# Patient Record
Sex: Female | Born: 2000 | Race: Black or African American | Hispanic: No | Smoking: Never smoker
Health system: Southern US, Community
[De-identification: ages and names within clinical notes are randomized; demographics above are authoritative.]

## PROBLEM LIST (undated history)

## (undated) ENCOUNTER — Inpatient Hospital Stay (HOSPITAL_COMMUNITY): Payer: Self-pay

## (undated) DIAGNOSIS — Z8619 Personal history of other infectious and parasitic diseases: Secondary | ICD-10-CM

## (undated) DIAGNOSIS — D649 Anemia, unspecified: Secondary | ICD-10-CM

## (undated) DIAGNOSIS — T7840XA Allergy, unspecified, initial encounter: Secondary | ICD-10-CM

## (undated) DIAGNOSIS — Z789 Other specified health status: Secondary | ICD-10-CM

## (undated) HISTORY — DX: Personal history of other infectious and parasitic diseases: Z86.19

## (undated) HISTORY — PX: WISDOM TOOTH EXTRACTION: SHX21

## (undated) HISTORY — DX: Allergy, unspecified, initial encounter: T78.40XA

## (undated) HISTORY — DX: Anemia, unspecified: D64.9

## (undated) HISTORY — PX: NO PAST SURGERIES: SHX2092

---
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
## 2018-02-15 ENCOUNTER — Telehealth: Payer: Self-pay | Admitting: Internal Medicine

## 2018-02-15 NOTE — Telephone Encounter (Signed)
Pt need a depo shot by 1.29.20 and no nurse visit slots was available. Advise mom someone would call her to schedule.

## 2018-02-18 NOTE — Telephone Encounter (Signed)
Pt is scheduled for 02/23/18 for Depo

## 2018-02-23 ENCOUNTER — Ambulatory Visit (INDEPENDENT_AMBULATORY_CARE_PROVIDER_SITE_OTHER): Payer: BC Managed Care – PPO

## 2018-02-23 DIAGNOSIS — Z3042 Encounter for surveillance of injectable contraceptive: Secondary | ICD-10-CM | POA: Diagnosis not present

## 2018-02-23 MED ORDER — MEDROXYPROGESTERONE ACETATE 150 MG/ML IM SUSP
150.0000 mg | Freq: Once | INTRAMUSCULAR | Status: AC
  Administered 2018-02-23: 150 mg via INTRAMUSCULAR

## 2018-02-23 NOTE — Progress Notes (Signed)
Per orders Nicki Reaper, NP-C, injection of Depo Provera given by Roena Malady. Patient tolerated injection well. Patient prefers arm injection

## 2018-02-24 ENCOUNTER — Ambulatory Visit: Payer: BC Managed Care – PPO

## 2018-04-12 ENCOUNTER — Other Ambulatory Visit: Payer: Self-pay

## 2018-04-12 ENCOUNTER — Ambulatory Visit
Admission: RE | Admit: 2018-04-12 | Discharge: 2018-04-12 | Disposition: A | Discharge status: Home / Self Care | Payer: BC Managed Care – PPO | Source: Ambulatory Visit | Attending: Internal Medicine | Admitting: Internal Medicine

## 2018-04-12 ENCOUNTER — Encounter: Payer: Self-pay | Admitting: Internal Medicine

## 2018-04-12 ENCOUNTER — Ambulatory Visit (INDEPENDENT_AMBULATORY_CARE_PROVIDER_SITE_OTHER): Payer: BC Managed Care – PPO | Admitting: Internal Medicine

## 2018-04-12 VITALS — BP 112/70 | HR 97 | Temp 98.2°F | Wt 132.0 lb

## 2018-04-12 DIAGNOSIS — R1030 Lower abdominal pain, unspecified: Secondary | ICD-10-CM | POA: Diagnosis not present

## 2018-04-12 DIAGNOSIS — R6883 Chills (without fever): Secondary | ICD-10-CM | POA: Diagnosis not present

## 2018-04-12 DIAGNOSIS — R109 Unspecified abdominal pain: Secondary | ICD-10-CM

## 2018-04-12 DIAGNOSIS — R110 Nausea: Secondary | ICD-10-CM

## 2018-04-12 LAB — COMPREHENSIVE METABOLIC PANEL
ALK PHOS: 88 U/L (ref 47–119)
ALT: 10 U/L (ref 0–35)
AST: 11 U/L (ref 0–37)
Albumin: 4.5 g/dL (ref 3.5–5.2)
BUN: 15 mg/dL (ref 6–23)
CALCIUM: 9.1 mg/dL (ref 8.4–10.5)
CHLORIDE: 109 meq/L (ref 96–112)
CO2: 26 mEq/L (ref 19–32)
Creatinine, Ser: 0.73 mg/dL (ref 0.40–1.20)
GFR: 123.7 mL/min (ref >60.00)
Glucose, Bld: 82 mg/dL (ref 70–99)
POTASSIUM: 3.7 meq/L (ref 3.5–5.1)
SODIUM: 141 meq/L (ref 135–145)
TOTAL PROTEIN: 7.4 g/dL (ref 6.0–8.3)
Total Bilirubin: 1.1 mg/dL (ref 0.2–1.2)

## 2018-04-12 LAB — CBC
HEMATOCRIT: 38.8 % (ref 36.0–49.0)
HEMOGLOBIN: 13 g/dL (ref 12.0–16.0)
MCHC: 33.5 g/dL (ref 31.0–37.0)
MCV: 88.2 fl (ref 78.0–98.0)
PLATELETS: 169 10*3/uL (ref 150.0–575.0)
RBC: 4.4 Mil/uL (ref 3.80–5.70)
RDW: 12.7 % (ref 11.4–15.5)
WBC: 4.4 10*3/uL — ABNORMAL LOW (ref 4.5–13.5)

## 2018-04-12 LAB — POC URINALSYSI DIPSTICK (AUTOMATED)
Blood, UA: NEGATIVE
Glucose, UA: NEGATIVE
KETONES UA: NEGATIVE
Leukocytes, UA: NEGATIVE
Nitrite, UA: NEGATIVE
PH UA: 5.5 (ref 5.0–8.0)
PROTEIN UA: NEGATIVE
Spec Grav, UA: >=1.03 — AB (ref 1.010–1.025)
UROBILINOGEN UA: 0.2 U/dL

## 2018-04-12 NOTE — Progress Notes (Signed)
Subjective:    Patient ID: Sharon Fuller, female    DOB: 05/28/2000, 18 y.o.   MRN: [id number]  HPI  Pt presents to the clinic today with c/o abdominal pain. She reports this started last night. The pain is around the belly button. The pain radiates into her right side of her back. She describes the pain as constant cramping but intermittent sharp and stabbing. She has had some nausea, gas but denies vomiting, diarrhea, constipation or blood in her stool. She denies urinary or vaginal complaints. She is currently on her menses. She denies fever or chills but has had body aches. She has not had a recent change in diet or medications. She is not taking OTC NSAIDs. She has never had kidney stones but does have a family history of them.  Review of Systems      Past Medical History:  Diagnosis Date  . Allergy   . History of chicken pox   . Medical history non-contributory     Current Outpatient Medications  Medication Sig Dispense Refill  . medroxyPROGESTERone (DEPO-PROVERA) 150 MG/ML injection Depo-Provera 150 mg/mL intramuscular suspension  Inject 1 mL every 3 months by intramuscular route.     No current facility-administered medications for this visit.     No Known Allergies  Family History  Problem Relation Age of Onset  . Kidney failure Mother   . Breast cancer Maternal Grandmother   . Kidney failure Maternal Grandfather   . Asthma Father     Social History   Socioeconomic History  . Marital status: Single    Spouse name: Not on file  . Number of children: Not on file  . Years of education: Not on file  . Highest education level: Not on file  Occupational History  . Not on file  Social Needs  . Financial resource strain: Not on file  . Food insecurity:    Worry: Not on file    Inability: Not on file  . Transportation needs:    Medical: Not on file    Non-medical: Not on file  Tobacco Use  . Smoking status: Never Smoker  . Smokeless tobacco: Never Used   Substance and Sexual Activity  . Alcohol use: Not Currently    Frequency: Never    Comment: not during pregnancy  . Drug use: No  . Sexual activity: Yes    Birth control/protection: None  Lifestyle  . Physical activity:    Days per week: Not on file    Minutes per session: Not on file  . Stress: Not on file  Relationships  . Social connections:    Talks on phone: Not on file    Gets together: Not on file    Attends religious service: Not on file    Active member of club or organization: Not on file    Attends meetings of clubs or organizations: Not on file    Relationship status: Not on file  . Intimate partner violence:    Fear of current or ex partner: Not on file    Emotionally abused: Not on file    Physically abused: Not on file    Forced sexual activity: Not on file  Other Topics Concern  . Not on file  Social History Narrative  . Not on file     Constitutional: Denies fever, malaise, fatigue, headache or abrupt weight changes.  Respiratory: Denies difficulty breathing, shortness of breath, cough or sputum production.   Cardiovascular: Denies chest pain, chest tightness, palpitations  or swelling in the hands or feet.  Gastrointestinal: Pt reports abdominal pain, nausea, gas. Denies bloating, constipation, diarrhea or blood in the stool.  GU: Denies urgency, frequency, pain with urination, burning sensation, blood in urine, odor or discharge.  No other specific complaints in a complete review of systems (except as listed in HPI above).  Objective:   Physical Exam   Wt 132 lb (59.9 kg)   LMP 04/09/2018   BMI 23.76 kg/m  Wt Readings from Last 3 Encounters:  04/12/18 132 lb (59.9 kg) (58 %, Z= 0.21)*  11/24/17 127 lb (57.6 kg) (51 %, Z= 0.03)*  06/16/17 147 lb 6.4 oz (66.9 kg) (81 %, Z= 0.87)*   * Growth percentiles are based on CDC (Girls, 2-20 Years) data.    General: Appears her stated age, well developed, well nourished in NAD. Skin: Warm, dry and  intact. No rashesnoted. Cardiovascular: Normal rate and rhythm.  Pulmonary/Chest: Normal effort and positive vesicular breath sounds. No respiratory distress. No wheezes, rales or ronchi noted.  Abdomen: Soft and generally tender in bilateral lower quadrants. Hypoactive bowel sounds. No distention or masses noted. No CVA tenderness noted Neurological: Alert and oriented.        Assessment & Plan:   Lower Abdominal Pain, Right Flank Pain, Nausea and Chills:  Concerning for kidney stone Urinalysis: 1+ bilirubin Will send urine culture CBC and CMET today Will obtain CT renal stone study  Will follow up after results, return/ER precautions discussed Nicki Reaper, NP

## 2018-04-12 NOTE — Patient Instructions (Signed)

## 2018-04-13 LAB — URINE CULTURE
MICRO NUMBER:: 328436
SPECIMEN QUALITY:: ADEQUATE

## 2018-04-21 ENCOUNTER — Encounter: Payer: Self-pay | Admitting: Internal Medicine

## 2018-04-26 ENCOUNTER — Encounter: Payer: Self-pay | Admitting: Internal Medicine

## 2018-04-26 ENCOUNTER — Other Ambulatory Visit: Payer: Self-pay

## 2018-04-26 ENCOUNTER — Ambulatory Visit (INDEPENDENT_AMBULATORY_CARE_PROVIDER_SITE_OTHER): Payer: BC Managed Care – PPO | Admitting: Internal Medicine

## 2018-04-26 DIAGNOSIS — J301 Allergic rhinitis due to pollen: Secondary | ICD-10-CM

## 2018-04-26 NOTE — Patient Instructions (Signed)

## 2018-04-26 NOTE — Progress Notes (Signed)
Virtual Visit via Video Note  I connected with Sharon Fuller on 04/26/18 at  3:30 PM EDT by a video enabled telemedicine application and verified that I am speaking with the correct person using two identifiers.   I discussed the limitations of evaluation and management by telemedicine and the availability of in person appointments. The patient expressed understanding and agreed to proceed.  History of Present Illness:  Pt reports runny nose, sore throat and cough. This started 2-3 days ago. She is blowing yellow/green mucous out of her nose. She denies difficulty swallowing. The cough is productive of yellow/green mucous She reports associated ear fullness sneezing. She denies headache, facial pressure or shortness of breath. She denies fever, chills or body aches. She has not taken anything OTC for her symptoms. She has a history of allergies but reports this feels worse. She has not had sick contacts.   Observations/Objective:  Alert and oriented x 3 NAD Does not sound congested No obvious SOB  Assessment and Plan:  Allergic Rhinitis:  Start Claritin daily in pm x 1 week Start Flonase 1 spray each nostril BID x 3 days then daily thereafter No indication for abx at this time Advised her to update me Friday/Monday if symptoms persist or worsen  Follow Up Instructions:    I discussed the assessment and treatment plan with the patient. The patient was provided an opportunity to ask questions and all were answered. The patient agreed with the plan and demonstrated an understanding of the instructions.   The patient was advised to call back or seek an in-person evaluation if the symptoms worsen or if the condition fails to improve as anticipated.    Nicki Reaper, NP

## 2018-05-12 ENCOUNTER — Ambulatory Visit: Payer: BC Managed Care – PPO

## 2018-05-18 ENCOUNTER — Ambulatory Visit (INDEPENDENT_AMBULATORY_CARE_PROVIDER_SITE_OTHER): Payer: BC Managed Care – PPO

## 2018-05-18 ENCOUNTER — Other Ambulatory Visit: Payer: Self-pay

## 2018-05-18 DIAGNOSIS — Z3042 Encounter for surveillance of injectable contraceptive: Secondary | ICD-10-CM | POA: Diagnosis not present

## 2018-05-18 MED ORDER — MEDROXYPROGESTERONE ACETATE 150 MG/ML IM SUSP
150.0000 mg | Freq: Once | INTRAMUSCULAR | Status: AC
  Administered 2018-05-18: 150 mg via INTRAMUSCULAR

## 2018-05-18 NOTE — Progress Notes (Signed)
Per orders of NP Baity, injection of Depo-Provera given by Nanci Pina. Patient tolerated injection well.

## 2018-08-11 ENCOUNTER — Ambulatory Visit (INDEPENDENT_AMBULATORY_CARE_PROVIDER_SITE_OTHER): Payer: BC Managed Care – PPO

## 2018-08-11 ENCOUNTER — Other Ambulatory Visit: Payer: Self-pay

## 2018-08-11 DIAGNOSIS — Z3042 Encounter for surveillance of injectable contraceptive: Secondary | ICD-10-CM | POA: Diagnosis not present

## 2018-08-11 MED ORDER — MEDROXYPROGESTERONE ACETATE 150 MG/ML IM SUSP
150.0000 mg | Freq: Once | INTRAMUSCULAR | Status: AC
  Administered 2018-08-11: 150 mg via INTRAMUSCULAR

## 2018-08-11 NOTE — Progress Notes (Signed)
Per orders of Jacquelynn Cree, injection of Depo Provera 150mg  given by Diamond Nickel, RN.  Administered to right deltoid IM.   Patient requests depo be administered to smaller deltoid muscle despite education of risks given thickness and irritation of medication.  Although it can be administered in the deltoid, it is best to be administered in a larger muscle such as the ventro/dorsal gluteal muscle.    Patient tolerated injection well.  Patient is within range to move forward with today's admin and given next due date range between 10/1 - 10/15.

## 2018-09-05 ENCOUNTER — Encounter: Payer: Self-pay | Admitting: Internal Medicine

## 2018-09-05 ENCOUNTER — Ambulatory Visit (INDEPENDENT_AMBULATORY_CARE_PROVIDER_SITE_OTHER): Payer: BC Managed Care – PPO | Admitting: Internal Medicine

## 2018-09-05 ENCOUNTER — Other Ambulatory Visit: Payer: Self-pay

## 2018-09-05 VITALS — BP 112/72 | HR 92 | Temp 98.0°F | Ht 62.5 in | Wt 134.0 lb

## 2018-09-05 DIAGNOSIS — B354 Tinea corporis: Secondary | ICD-10-CM | POA: Diagnosis not present

## 2018-09-05 MED ORDER — TERBINAFINE HCL 250 MG PO TABS: 250.0000 mg | ORAL_TABLET | Freq: Every day | ORAL | 0 refills | Status: DC

## 2018-09-05 NOTE — Progress Notes (Signed)
Subjective:    Patient ID: Sharon Fuller, female    DOB: [birth year], 18 y.o.   MRN: [id number]  HPI  Pt presents to the clinic today with c/o a rash on her stomach. She noticed this 2 months ago. The rash is located on her upper stomach and has now spread under her right breast. She denies changes in soap, lotions or detergents. She has not come in contact with anything that she is allergic to. She has tried lotion and Vit E oil with minimal relief.  Review of Systems  Past Medical History:  Diagnosis Date  . Allergy   . History of chicken pox   . Medical history non-contributory     Current Outpatient Medications  Medication Sig Dispense Refill  . medroxyPROGESTERone (DEPO-PROVERA) 150 MG/ML injection Depo-Provera 150 mg/mL intramuscular suspension  Inject 1 mL every 3 months by intramuscular route.     No current facility-administered medications for this visit.     No Known Allergies  Family History  Problem Relation Age of Onset  . Kidney failure Mother   . Breast cancer Maternal Grandmother   . Kidney failure Maternal Grandfather   . Asthma Father     Social History   Socioeconomic History  . Marital status: Single    Spouse name: Not on file  . Number of children: Not on file  . Years of education: Not on file  . Highest education level: Not on file  Occupational History  . Not on file  Social Needs  . Financial resource strain: Not on file  . Food insecurity    Worry: Not on file    Inability: Not on file  . Transportation needs    Medical: Not on file    Non-medical: Not on file  Tobacco Use  . Smoking status: Never Smoker  . Smokeless tobacco: Never Used  Substance and Sexual Activity  . Alcohol use: Not Currently    Frequency: Never    Comment: not during pregnancy  . Drug use: No  . Sexual activity: Yes    Birth control/protection: None  Lifestyle  . Physical activity    Days per week: Not on file    Minutes per session: Not on file  .  Stress: Not on file  Relationships  . Social Herbalist on phone: Not on file    Gets together: Not on file    Attends religious service: Not on file    Active member of club or organization: Not on file    Attends meetings of clubs or organizations: Not on file    Relationship status: Not on file  . Intimate partner violence    Fear of current or ex partner: Not on file    Emotionally abused: Not on file    Physically abused: Not on file    Forced sexual activity: Not on file  Other Topics Concern  . Not on file  Social History Narrative  . Not on file     Constitutional: Denies fever, malaise, fatigue, headache or abrupt weight changes.  Respiratory: Denies difficulty breathing, shortness of breath, cough or sputum production.   Cardiovascular: Denies chest pain, chest tightness, palpitations or swelling in the hands or feet.  Skin: Pt reports rash of stomach. Denies lesions or ulcercations.    No other specific complaints in a complete review of systems (except as listed in HPI above).     Objective:   Physical Exam   BP 112/72  Pulse 92   Temp 98 F (36.7 C) (Temporal)   Ht 5' 2.5" (1.588 m)   Wt 134 lb (60.8 kg)   SpO2 98%   BMI 24.12 kg/m  Wt Readings from Last 3 Encounters:  09/05/18 134 lb (60.8 kg) (60 %, Z= 0.26)*  04/12/18 132 lb (59.9 kg) (58 %, Z= 0.21)*  11/24/17 127 lb (57.6 kg) (51 %, Z= 0.03)*   * Growth percentiles are based on CDC (Girls, 2-20 Years) data.    General: Appears their stated age, well developed, well nourished in NAD. Skin: 1.5 cm round scaly lesion with central clearing of right upper abdomen. Similar smaller group of lesions noted under right breast. Cardiovascular: Normal rate and rhythm.  Pulmonary/Chest: Normal effort and positive vesicular breath sounds. No respiratory distress. No wheezes, rales or ronchi noted.  Neurological: Alert and oriented.   BMET    Component Value Date/Time   NA 141 04/12/2018 1145    K 3.7 04/12/2018 1145   CL 109 04/12/2018 1145   CO2 26 04/12/2018 1145   GLUCOSE 82 04/12/2018 1145   BUN 15 04/12/2018 1145   CREATININE 0.73 04/12/2018 1145   CALCIUM 9.1 04/12/2018 1145    Lipid Panel  No results found for: CHOL, TRIG, HDL, CHOLHDL, VLDL, LDLCALC  CBC    Component Value Date/Time   WBC 4.4 (L) 04/12/2018 1145   RBC 4.40 04/12/2018 1145   HGB 13.0 04/12/2018 1145   HCT 38.8 04/12/2018 1145   PLT 169.0 04/12/2018 1145   MCV 88.2 04/12/2018 1145   MCH 31.8 06/17/2017 0540   MCHC 33.5 04/12/2018 1145   RDW 12.7 04/12/2018 1145    Hgb A1C No results found for: HGBA1C         Assessment & Plan:   Rash of Stomach:  CMET reviewed RX for Terbinafine 250 mg daily x 2 weeks Wash hands after touching area   Return precautions discussed Nicki Reaperegina Baity, NP

## 2018-09-06 ENCOUNTER — Encounter: Payer: Self-pay | Admitting: Internal Medicine

## 2018-09-06 NOTE — Patient Instructions (Signed)

## 2018-09-28 ENCOUNTER — Telehealth: Payer: Self-pay | Admitting: *Deleted

## 2018-09-28 DIAGNOSIS — B354 Tinea corporis: Secondary | ICD-10-CM

## 2018-09-28 NOTE — Addendum Note (Signed)
Addended by: Jearld Fenton on: 09/28/2018 11:48 AM   Modules accepted: Orders

## 2018-09-28 NOTE — Telephone Encounter (Signed)
Patient left a voicemail stating that she has been treated for ringworm and has finished her medication. Patent stated that it is spreading now and wants to know what to do? Pharmacy [facility]

## 2018-09-28 NOTE — Telephone Encounter (Signed)
I am going to refer her to derm for further eval and treatment.

## 2018-09-29 ENCOUNTER — Telehealth: Payer: Self-pay | Admitting: Internal Medicine

## 2018-09-29 NOTE — Telephone Encounter (Signed)
Pt is aware as instructed 

## 2018-09-29 NOTE — Telephone Encounter (Signed)
Pt wanted to update you that she finished medicine and it didn't work. Please call patient.  [phone]

## 2018-10-27 ENCOUNTER — Ambulatory Visit: Payer: BC Managed Care – PPO

## 2018-12-02 ENCOUNTER — Encounter: Payer: Self-pay | Admitting: Internal Medicine

## 2019-01-11 ENCOUNTER — Ambulatory Visit: Payer: BC Managed Care – PPO

## 2019-01-11 ENCOUNTER — Other Ambulatory Visit: Payer: Self-pay

## 2019-01-16 ENCOUNTER — Ambulatory Visit (INDEPENDENT_AMBULATORY_CARE_PROVIDER_SITE_OTHER): Payer: BC Managed Care – PPO

## 2019-01-16 DIAGNOSIS — Z111 Encounter for screening for respiratory tuberculosis: Secondary | ICD-10-CM | POA: Diagnosis not present

## 2019-01-16 NOTE — Progress Notes (Signed)
Per orders of Webb Silversmith, NP-C, TB Skin test, Right forearm given by Lurlean Nanny.  Patient tolerated well.

## 2019-01-18 LAB — TB SKIN TEST: TB Skin Test: NEGATIVE

## 2019-01-23 ENCOUNTER — Telehealth: Payer: Self-pay

## 2019-01-23 NOTE — Telephone Encounter (Signed)
Pt is aware that her portion is not filled out and needs her signature

## 2019-01-23 NOTE — Telephone Encounter (Signed)
Patient contacted the office and stated that she had a TB test completed and had given some paperwork to [facility] to be completed for her work with these results. Threasa Beards, do you have any paperwork from patient?  Let me know and I will be glad to call back and let her know the status. Thank you!

## 2019-02-21 ENCOUNTER — Ambulatory Visit (INDEPENDENT_AMBULATORY_CARE_PROVIDER_SITE_OTHER): Payer: BC Managed Care – PPO | Admitting: Internal Medicine

## 2019-02-21 ENCOUNTER — Encounter: Payer: Self-pay | Admitting: Internal Medicine

## 2019-02-21 ENCOUNTER — Other Ambulatory Visit: Payer: Self-pay

## 2019-02-21 VITALS — BP 112/74 | HR 84 | Temp 97.7°F | Wt 134.0 lb

## 2019-02-21 DIAGNOSIS — N898 Other specified noninflammatory disorders of vagina: Secondary | ICD-10-CM

## 2019-02-21 MED ORDER — FLUCONAZOLE 150 MG PO TABS: 150.0000 mg | ORAL_TABLET | Freq: Once | ORAL | 0 refills | Status: AC

## 2019-02-21 NOTE — Addendum Note (Signed)
Addended by: Roena Malady on: 02/21/2019 03:45 PM   Modules accepted: Orders

## 2019-02-21 NOTE — Patient Instructions (Signed)
Vaginal Yeast Infection, Adult  Vaginal yeast infection is a condition that causes vaginal discharge as well as soreness, swelling, and redness (inflammation) of the vagina. This is a common condition. Some women get this infection frequently. What are the causes? This condition is caused by a change in the normal balance of the yeast (candida) and bacteria that live in the vagina. This change causes an overgrowth of yeast, which causes the inflammation. What increases the risk? The condition is more likely to develop in women who:  Take antibiotic medicines.  Have diabetes.  Take birth control pills.  Are pregnant.  Douche often.  Have a weak body defense system (immune system).  Have been taking steroid medicines for a long time.  Frequently wear tight clothing. What are the signs or symptoms? Symptoms of this condition include:  White, thick, creamy vaginal discharge.  Swelling, itching, redness, and irritation of the vagina. The lips of the vagina (vulva) may be affected as well.  Pain or a burning feeling while urinating.  Pain during sex. How is this diagnosed? This condition is diagnosed based on:  Your medical history.  A physical exam.  A pelvic exam. Your health care provider will examine a sample of your vaginal discharge under a microscope. Your health care provider may send this sample for testing to confirm the diagnosis. How is this treated? This condition is treated with medicine. Medicines may be over-the-counter or prescription. You may be told to use one or more of the following:  Medicine that is taken by mouth (orally).  Medicine that is applied as a cream (topically).  Medicine that is inserted directly into the vagina (suppository). Follow these instructions at home:  Lifestyle  Do not have sex until your health care provider approves. Tell your sex partner that you have a yeast infection. That person should go to his or her health care  provider and ask if they should also be treated.  Do not wear tight clothes, such as pantyhose or tight pants.  Wear breathable cotton underwear. General instructions  Take or apply over-the-counter and prescription medicines only as told by your health care provider.  Eat more yogurt. This may help to keep your yeast infection from returning.  Do not use tampons until your health care provider approves.  Try taking a sitz bath to help with discomfort. This is a warm water bath that is taken while you are sitting down. The water should only come up to your hips and should cover your buttocks. Do this 3-4 times per day or as told by your health care provider.  Do not douche.  If you have diabetes, keep your blood sugar levels under control.  Keep all follow-up visits as told by your health care provider. This is important. Contact a health care provider if:  You have a fever.  Your symptoms go away and then return.  Your symptoms do not get better with treatment.  Your symptoms get worse.  You have new symptoms.  You develop blisters in or around your vagina.  You have blood coming from your vagina and it is not your menstrual period.  You develop pain in your abdomen. Summary  Vaginal yeast infection is a condition that causes discharge as well as soreness, swelling, and redness (inflammation) of the vagina.  This condition is treated with medicine. Medicines may be over-the-counter or prescription.  Take or apply over-the-counter and prescription medicines only as told by your health care provider.  Do not douche.   Do not have sex or use tampons until your health care provider approves.  Contact a health care provider if your symptoms do not get better with treatment or your symptoms go away and then return. This information is not intended to replace advice given to you by your health care provider. Make sure you discuss any questions you have with your health care  provider. Document Revised: 08/12/2018 Document Reviewed: 05/31/2017 Elsevier Patient Education  2020 Elsevier Inc.  

## 2019-02-21 NOTE — Progress Notes (Signed)
Subjective:    Patient ID: Sharon Fuller, female    DOB: [birth year], 19 y.o.   MRN: [id number]  HPI  Pt presents to clinic today with c/o vaginal itching and white discharge. The white discharge began 3 weeks ago and the itching began 5 days ago. She took a bubble bath a few weeks ago. She has had yeast infections before. She denies fever, abdominal pain, urgency, frequency, dysuria or blood in her stool. She has no concerns about STD's.   Review of Systems  Past Medical History:  Diagnosis Date  . Allergy   . History of chicken pox   . Medical history non-contributory     Current Outpatient Medications  Medication Sig Dispense Refill  . medroxyPROGESTERone (DEPO-PROVERA) 150 MG/ML injection Depo-Provera 150 mg/mL intramuscular suspension  Inject 1 mL every 3 months by intramuscular route.    . terbinafine (LAMISIL) 250 MG tablet Take 1 tablet (250 mg total) by mouth daily. 14 tablet 0   No current facility-administered medications for this visit.    No Known Allergies  Family History  Problem Relation Age of Onset  . Kidney failure Mother   . Breast cancer Maternal Grandmother   . Kidney failure Maternal Grandfather   . Asthma Father     Social History   Socioeconomic History  . Marital status: Single    Spouse name: Not on file  . Number of children: Not on file  . Years of education: Not on file  . Highest education level: Not on file  Occupational History  . Not on file  Tobacco Use  . Smoking status: Never Smoker  . Smokeless tobacco: Never Used  Substance and Sexual Activity  . Alcohol use: Not Currently    Comment: not during pregnancy  . Drug use: No  . Sexual activity: Yes    Birth control/protection: None  Other Topics Concern  . Not on file  Social History Narrative  . Not on file   Social Determinants of Health   Financial Resource Strain:   . Difficulty of Paying Living Expenses: Not on file  Food Insecurity:   . Worried About Community education officer in the Last Year: Not on file  . Ran Out of Food in the Last Year: Not on file  Transportation Needs:   . Lack of Transportation (Medical): Not on file  . Lack of Transportation (Non-Medical): Not on file  Physical Activity:   . Days of Exercise per Week: Not on file  . Minutes of Exercise per Session: Not on file  Stress:   . Feeling of Stress : Not on file  Social Connections:   . Frequency of Communication with Friends and Family: Not on file  . Frequency of Social Gatherings with Friends and Family: Not on file  . Attends Religious Services: Not on file  . Active Member of Clubs or Organizations: Not on file  . Attends Banker Meetings: Not on file  . Marital Status: Not on file  Intimate Partner Violence:   . Fear of Current or Ex-Partner: Not on file  . Emotionally Abused: Not on file  . Physically Abused: Not on file  . Sexually Abused: Not on file     Constitutional: Denies fever, malaise, fatigue, headache or abrupt weight changes.  Gastrointestinal: Denies abdominal pain, bloating, constipation, diarrhea or blood in the stool.  GU: Pt reports white vaginal discharge and itching. Denies urgency, frequency, pain with urination, burning sensation, blood in urine, or odor.  No other specific complaints in a complete review of systems (except as listed in HPI above).     Objective:   Physical Exam  BP 112/74   Pulse 84   Temp 97.7 F (36.5 C) (Temporal)   Wt 134 lb (60.8 kg)   LMP 02/20/2019   BMI 24.12 kg/m   Wt Readings from Last 3 Encounters:  09/05/18 134 lb (60.8 kg) (60 %, Z= 0.26)*  04/12/18 132 lb (59.9 kg) (58 %, Z= 0.21)*  11/24/17 127 lb (57.6 kg) (51 %, Z= 0.03)*   * Growth percentiles are based on CDC (Girls, 2-20 Years) data.    General: Appears her stated age, well developed, well nourished in NAD. Cardiovascular: Normal rate and rhythm. S1,S2 noted.  No murmur, rubs or gallops noted.  Pulmonary/Chest: Normal effort  and positive vesicular breath sounds. No respiratory distress. No wheezes, rales or ronchi noted.  Abdomen: Soft and nontender.  Pelvic: Self swabbed. Neurological: Alert and oriented.    BMET    Component Value Date/Time   NA 141 04/12/2018 1145   K 3.7 04/12/2018 1145   CL 109 04/12/2018 1145   CO2 26 04/12/2018 1145   GLUCOSE 82 04/12/2018 1145   BUN 15 04/12/2018 1145   CREATININE 0.73 04/12/2018 1145   CALCIUM 9.1 04/12/2018 1145    Lipid Panel  No results found for: CHOL, TRIG, HDL, CHOLHDL, VLDL, LDLCALC  CBC    Component Value Date/Time   WBC 4.4 (L) 04/12/2018 1145   RBC 4.40 04/12/2018 1145   HGB 13.0 04/12/2018 1145   HCT 38.8 04/12/2018 1145   PLT 169.0 04/12/2018 1145   MCV 88.2 04/12/2018 1145   MCH 31.8 06/17/2017 0540   MCHC 33.5 04/12/2018 1145   RDW 12.7 04/12/2018 1145    Hgb A1C No results found for: HGBA1C         Assessment & Plan:   Vaginal Itching and Discharge:  Rx sent for Fluconazole 150mg  po x1 day, repeat in 3 days if symptoms persist  Will send wet prep Advised to avoid bubbles when taking a bath   Will follow up after labs, return precautions discussed  Webb Silversmith, NP This visit occurred during the SARS-CoV-2 public health emergency.  Safety protocols were in place, including screening questions prior to the visit, additional usage of staff PPE, and extensive cleaning of exam room while observing appropriate contact time as indicated for disinfecting solutions.

## 2019-02-22 LAB — WET PREP BY MOLECULAR PROBE
Candida species: DETECTED — AB
Gardnerella vaginalis: NOT DETECTED
MICRO NUMBER:: 10081807
SPECIMEN QUALITY:: ADEQUATE
Trichomonas vaginosis: NOT DETECTED

## 2019-03-16 ENCOUNTER — Ambulatory Visit: Payer: BC Managed Care – PPO | Admitting: Internal Medicine

## 2019-03-21 ENCOUNTER — Ambulatory Visit: Payer: BC Managed Care – PPO | Admitting: Internal Medicine

## 2019-04-11 ENCOUNTER — Ambulatory Visit: Payer: BC Managed Care – PPO | Admitting: Internal Medicine

## 2019-04-11 ENCOUNTER — Telehealth: Payer: Self-pay

## 2019-04-11 NOTE — Telephone Encounter (Signed)
[facility] Night - Client Nonclinical Telephone Record AccessNurse Client [facility] Night - Client Client Site [facility] - Night Physician Nicki Reaper - NP Contact Type Call Who Is Calling Patient / Member / Family / Caregiver Caller Name Dulcinea Kinser Caller Phone Number [phone] Patient Name Lotta Frankenfield Patient DOB Apr 18, 2000 Call Type Message Only Information Provided Reason for Call Request to [facility] Appointment Initial Comment Caller states she need to cancel her daughter's appointment for tomorrow Additional Comment Disp. Time Disposition Final User 04/10/2019 6:00:36 PM General Information Provided Yes Darryl Lent Call Closed By: Darryl Lent Transaction Date/Time: 04/10/2019 5:57:18 PM (ET)

## 2019-04-11 NOTE — Telephone Encounter (Signed)
Ok to cancel

## 2019-04-18 ENCOUNTER — Ambulatory Visit: Payer: BC Managed Care – PPO | Admitting: Internal Medicine

## 2019-04-18 NOTE — Progress Notes (Deleted)
Subjective:    Patient ID: Sharon Fuller, female    DOB: 2000-12-04, 19 y.o.   MRN: [id number]  HPI  Pt presents to the clinic today with c/o yeast infection. This started. She was recently treated for the same 02/21/19 for the same with Diflucan.   Review of Systems      Past Medical History:  Diagnosis Date   Allergy    History of chicken pox    Medical history non-contributory     No current outpatient medications on file.   No current facility-administered medications for this visit.    No Known Allergies  Family History  Problem Relation Age of Onset   Kidney failure Mother    Breast cancer Maternal Grandmother    Kidney failure Maternal Grandfather    Asthma Father     Social History   Socioeconomic History   Marital status: Single    Spouse name: Not on file   Number of children: Not on file   Years of education: Not on file   Highest education level: Not on file  Occupational History   Not on file  Tobacco Use   Smoking status: Never Smoker   Smokeless tobacco: Never Used  Substance and Sexual Activity   Alcohol use: Not Currently    Comment: not during pregnancy   Drug use: No   Sexual activity: Yes    Birth control/protection: None  Other Topics Concern   Not on file  Social History Narrative   Not on file   Social Determinants of Health   Financial Resource Strain:    Difficulty of Paying Living Expenses:   Food Insecurity:    Worried About Programme researcher, broadcasting/film/video in the Last Year:    Barista in the Last Year:   Transportation Needs:    Freight forwarder (Medical):    Lack of Transportation (Non-Medical):   Physical Activity:    Days of Exercise per Week:    Minutes of Exercise per Session:   Stress:    Feeling of Stress :   Social Connections:    Frequency of Communication with Friends and Family:    Frequency of Social Gatherings with Friends and Family:    Attends Religious Services:      Active Member of Clubs or Organizations:    Attends Engineer, structural:    Marital Status:   Intimate Partner Violence:    Fear of Current or Ex-Partner:    Emotionally Abused:    Physically Abused:    Sexually Abused:      Constitutional: Denies fever, malaise, fatigue, headache or abrupt weight changes.  HEENT: Denies eye pain, eye redness, ear pain, ringing in the ears, wax buildup, runny nose, nasal congestion, bloody nose, or sore throat. Respiratory: Denies difficulty breathing, shortness of breath, cough or sputum production.   Cardiovascular: Denies chest pain, chest tightness, palpitations or swelling in the hands or feet.  Gastrointestinal: Denies abdominal pain, bloating, constipation, diarrhea or blood in the stool.  GU: Denies urgency, frequency, pain with urination, burning sensation, blood in urine, odor or discharge. Musculoskeletal: Denies decrease in range of motion, difficulty with gait, muscle pain or joint pain and swelling.  Skin: Denies redness, rashes, lesions or ulcercations.  Neurological: Denies dizziness, difficulty with memory, difficulty with speech or problems with balance and coordination.  Psych: Denies anxiety, depression, SI/HI.  No other specific complaints in a complete review of systems (except as listed in HPI above).  Objective:  Physical Exam   There were no vitals taken for this visit. Wt Readings from Last 3 Encounters:  02/21/19 134 lb (60.8 kg)  09/05/18 134 lb (60.8 kg) (60 %, Z= 0.26)*  04/12/18 132 lb (59.9 kg) (58 %, Z= 0.21)*   * Growth percentiles are based on CDC (Girls, 2-20 Years) data.    General: Appears their stated age, well developed, well nourished in NAD. Skin: Warm, dry and intact. No rashes, lesions or ulcerations noted. HEENT: Head: normal shape and size; Eyes: sclera white, no icterus, conjunctiva pink, PERRLA and EOMs intact; Ears: Tm's gray and intact, normal light reflex; Nose: mucosa pink  and moist, septum midline; Throat/Mouth: Teeth present, mucosa pink and moist, no exudate, lesions or ulcerations noted.  Neck:  Neck supple, trachea midline. No masses, lumps or thyromegaly present.  Cardiovascular: Normal rate and rhythm. S1,S2 noted.  No murmur, rubs or gallops noted. No JVD or BLE edema. No carotid bruits noted. Pulmonary/Chest: Normal effort and positive vesicular breath sounds. No respiratory distress. No wheezes, rales or ronchi noted.  Abdomen: Soft and nontender. Normal bowel sounds. No distention or masses noted. Liver, spleen and kidneys non palpable. Musculoskeletal: Normal range of motion. No signs of joint swelling. No difficulty with gait.  Neurological: Alert and oriented. Cranial nerves II-XII grossly intact. Coordination normal.  Psychiatric: Mood and affect normal. Behavior is normal. Judgment and thought content normal.   EKG:  BMET    Component Value Date/Time   NA 141 04/12/2018 1145   K 3.7 04/12/2018 1145   CL 109 04/12/2018 1145   CO2 26 04/12/2018 1145   GLUCOSE 82 04/12/2018 1145   BUN 15 04/12/2018 1145   CREATININE 0.73 04/12/2018 1145   CALCIUM 9.1 04/12/2018 1145    Lipid Panel  No results found for: CHOL, TRIG, HDL, CHOLHDL, VLDL, LDLCALC  CBC    Component Value Date/Time   WBC 4.4 (L) 04/12/2018 1145   RBC 4.40 04/12/2018 1145   HGB 13.0 04/12/2018 1145   HCT 38.8 04/12/2018 1145   PLT 169.0 04/12/2018 1145   MCV 88.2 04/12/2018 1145   MCH 31.8 06/17/2017 0540   MCHC 33.5 04/12/2018 1145   RDW 12.7 04/12/2018 1145    Hgb A1C No results found for: HGBA1C         Assessment & Plan:    Webb Silversmith, NP This visit occurred during the SARS-CoV-2 public health emergency.  Safety protocols were in place, including screening questions prior to the visit, additional usage of staff PPE, and extensive cleaning of exam room while observing appropriate contact time as indicated for disinfecting solutions.

## 2019-04-27 ENCOUNTER — Other Ambulatory Visit (HOSPITAL_COMMUNITY)
Admission: RE | Admit: 2019-04-27 | Discharge: 2019-04-27 | Disposition: A | Discharge status: Home / Self Care | Payer: BC Managed Care – PPO | Source: Ambulatory Visit | Attending: Internal Medicine | Admitting: Internal Medicine

## 2019-04-27 ENCOUNTER — Encounter: Payer: Self-pay | Admitting: Internal Medicine

## 2019-04-27 ENCOUNTER — Ambulatory Visit (INDEPENDENT_AMBULATORY_CARE_PROVIDER_SITE_OTHER): Payer: BC Managed Care – PPO | Admitting: Internal Medicine

## 2019-04-27 ENCOUNTER — Other Ambulatory Visit: Payer: Self-pay

## 2019-04-27 VITALS — BP 110/72 | HR 88 | Temp 98.3°F | Wt 137.0 lb

## 2019-04-27 DIAGNOSIS — Z113 Encounter for screening for infections with a predominantly sexual mode of transmission: Secondary | ICD-10-CM | POA: Diagnosis present

## 2019-04-27 DIAGNOSIS — Z124 Encounter for screening for malignant neoplasm of cervix: Secondary | ICD-10-CM

## 2019-04-27 DIAGNOSIS — N898 Other specified noninflammatory disorders of vagina: Secondary | ICD-10-CM

## 2019-04-27 LAB — POCT GLYCOSYLATED HEMOGLOBIN (HGB A1C): Hemoglobin A1C: 4.9 % (ref 4.0–5.6)

## 2019-04-27 MED ORDER — FLUCONAZOLE 150 MG PO TABS: 150.0000 mg | ORAL_TABLET | Freq: Once | ORAL | 0 refills | Status: AC

## 2019-04-27 NOTE — Patient Instructions (Signed)

## 2019-04-27 NOTE — Progress Notes (Signed)
Subjective:    Patient ID: Sharon Fuller, female    DOB: 2000/09/21, 21 y.o.   MRN: [id number]  HPI  Pt presents to the clinic today with c/o vaginal discharge, external irritation. The discharge is thin, white. There is no odor. She denies abnormal vaginal bleeding, abdominal pain. She denies urinary symptoms. She does not douche, take bubble baths or feminine washes. She is not currently sexually active and denies concern for STD. She was last treated for a yeast infection 02/21/19.  Review of Systems      Past Medical History:  Diagnosis Date  . Allergy   . History of chicken pox   . Medical history non-contributory     No current outpatient medications on file.   No current facility-administered medications for this visit.    No Known Allergies  Family History  Problem Relation Age of Onset  . Kidney failure Mother   . Breast cancer Maternal Grandmother   . Kidney failure Maternal Grandfather   . Asthma Father     Social History   Socioeconomic History  . Marital status: Single    Spouse name: Not on file  . Number of children: Not on file  . Years of education: Not on file  . Highest education level: Not on file  Occupational History  . Not on file  Tobacco Use  . Smoking status: Never Smoker  . Smokeless tobacco: Never Used  Substance and Sexual Activity  . Alcohol use: Not Currently    Comment: not during pregnancy  . Drug use: No  . Sexual activity: Yes    Birth control/protection: None  Other Topics Concern  . Not on file  Social History Narrative  . Not on file   Social Determinants of Health   Financial Resource Strain:   . Difficulty of Paying Living Expenses:   Food Insecurity:   . Worried About Charity fundraiser in the Last Year:   . Arboriculturist in the Last Year:   Transportation Needs:   . Film/video editor (Medical):   Marland Kitchen Lack of Transportation (Non-Medical):   Physical Activity:   . Days of Exercise per Week:   .  Minutes of Exercise per Session:   Stress:   . Feeling of Stress :   Social Connections:   . Frequency of Communication with Friends and Family:   . Frequency of Social Gatherings with Friends and Family:   . Attends Religious Services:   . Active Member of Clubs or Organizations:   . Attends Archivist Meetings:   Marland Kitchen Marital Status:   Intimate Partner Violence:   . Fear of Current or Ex-Partner:   . Emotionally Abused:   Marland Kitchen Physically Abused:   . Sexually Abused:      Constitutional: Denies fever, malaise, fatigue, headache or abrupt weight changes.  Respiratory: Denies difficulty breathing, shortness of breath, cough or sputum production.   Cardiovascular: Denies chest pain, chest tightness, palpitations or swelling in the hands or feet.  Gastrointestinal: Denies abdominal pain, bloating, constipation, diarrhea or blood in the stool.  GU: Pt reports vaginal discharge, irritation. Denies urgency, frequency, pain with urination, burning sensation, blood in urine, odor.  No other specific complaints in a complete review of systems (except as listed in HPI above).  Objective:   Physical Exam  BP 110/72   Pulse 88   Temp 98.3 F (36.8 C) (Temporal)   Wt 137 lb (62.1 kg)   LMP 03/31/2019  BMI 24.66 kg/m   Wt Readings from Last 3 Encounters:  02/21/19 134 lb (60.8 kg)  09/05/18 134 lb (60.8 kg) (60 %, Z= 0.26)*  04/12/18 132 lb (59.9 kg) (58 %, Z= 0.21)*   * Growth percentiles are based on CDC (Girls, 2-20 Years) data.    General: Appears his stated age, well developed, well nourished in NAD. Cardiovascular: Normal rate and rhythm. Pulmonary/Chest: Normal effort and positive vesicular breath sounds. No respiratory distress. No wheezes, rales or ronchi noted.  Abdomen: Soft and nontender. Normal bowel sounds. No distention or masses noted.  Pelvic: Normal female anatomy. No external lesions noted. Cervix not well visualized. Small amount of thin white discharge  noted. No odor. No CMT tenderness noted. Adnexa non palpable. Neurological: Alert and oriented.  BMET    Component Value Date/Time   NA 141 04/12/2018 1145   K 3.7 04/12/2018 1145   CL 109 04/12/2018 1145   CO2 26 04/12/2018 1145   GLUCOSE 82 04/12/2018 1145   BUN 15 04/12/2018 1145   CREATININE 0.73 04/12/2018 1145   CALCIUM 9.1 04/12/2018 1145    Lipid Panel  No results found for: CHOL, TRIG, HDL, CHOLHDL, VLDL, LDLCALC  CBC    Component Value Date/Time   WBC 4.4 (L) 04/12/2018 1145   RBC 4.40 04/12/2018 1145   HGB 13.0 04/12/2018 1145   HCT 38.8 04/12/2018 1145   PLT 169.0 04/12/2018 1145   MCV 88.2 04/12/2018 1145   MCH 31.8 06/17/2017 0540   MCHC 33.5 04/12/2018 1145   RDW 12.7 04/12/2018 1145    Hgb A1C No results found for: HGBA1C     Assessment & Plan:   Screening for Cervical Cancer, Screen for STD, Vaginal Discharge and Irritation:  Pap smear obtained with STD testing RX for Diflucan 150 mg PO x 1, repeat in 3 days POCT A1C  Will follow up after labs, return precautions discussed  Nicki Reaper, NP This visit occurred during the SARS-CoV-2 public health emergency.  Safety protocols were in place, including screening questions prior to the visit, additional usage of staff PPE, and extensive cleaning of exam room while observing appropriate contact time as indicated for disinfecting solutions.

## 2019-05-01 LAB — CYTOLOGY - PAP
Chlamydia: NEGATIVE
Comment: NEGATIVE
Comment: NEGATIVE
Comment: NORMAL
Diagnosis: NEGATIVE
Neisseria Gonorrhea: NEGATIVE
Trichomonas: NEGATIVE

## 2020-01-14 IMAGING — CT CT RENAL STONE PROTOCOL
1 of 2 series · 14 of 32 positions shown, 19 images · non-contrast
Comparison: No priors.

CLINICAL DATA: 17-year-old female with history of right-sided flank
pain for 1 day. Nausea.

EXAM:
CT ABDOMEN AND PELVIS WITHOUT CONTRAST
TECHNIQUE: Multidetector CT imaging of the abdomen and pelvis was performed
following the standard protocol without IV contrast.

[Series 2: renal standard/full · axial · 0.75mm/px · z∈[-427,-27]mm · 14 of 91 slices shown, 19 images]
[im 6/91  soft-tissue]
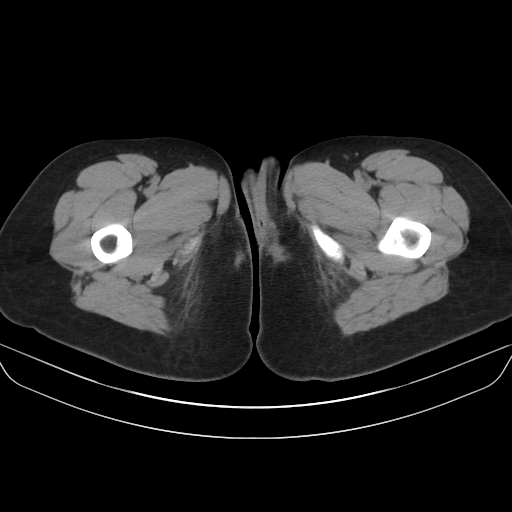
[im 6/91  bone]
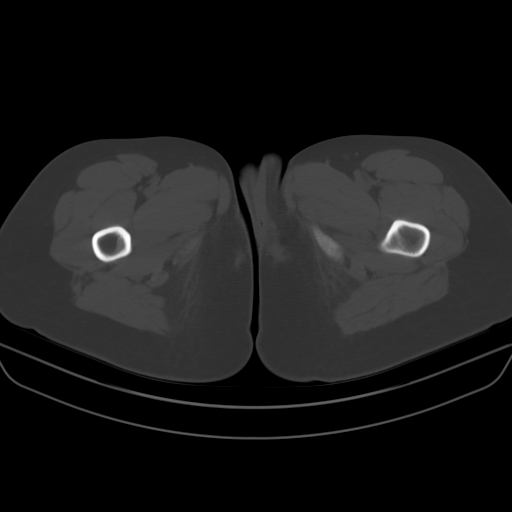
[im 11/91  soft-tissue]
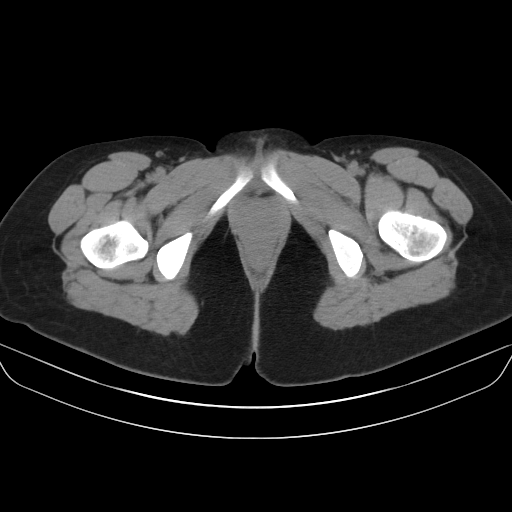
[im 21/91  soft-tissue]
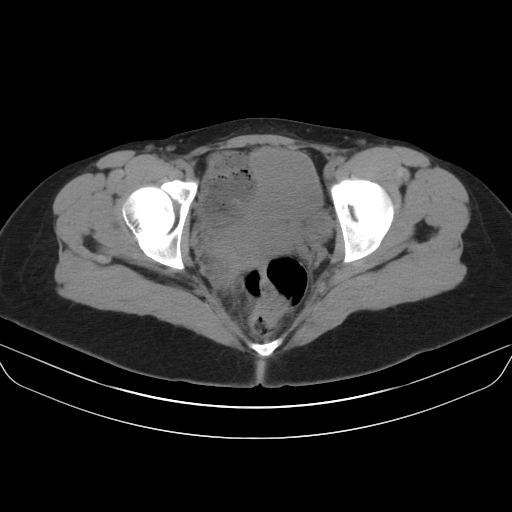
[im 26/91  soft-tissue]
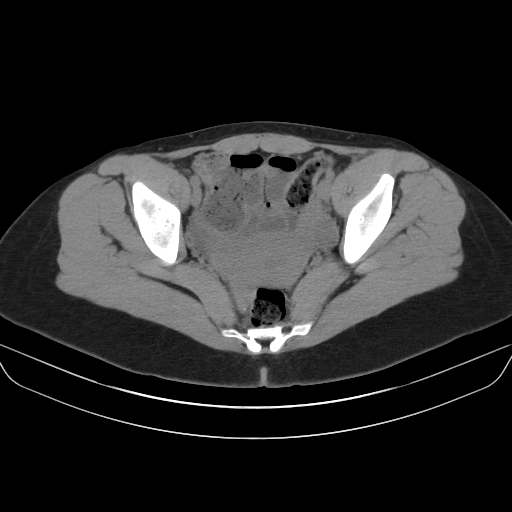
[im 31/91  soft-tissue]
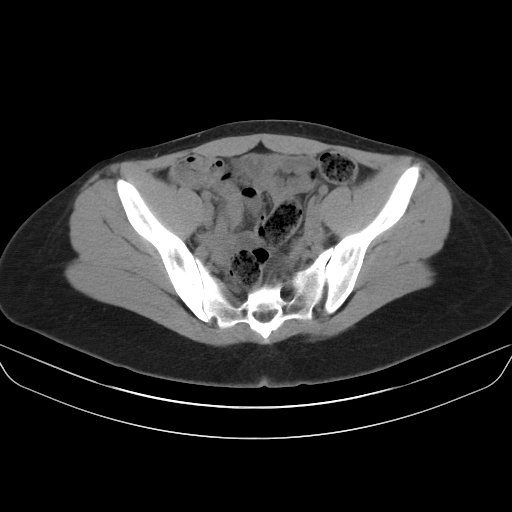
[im 41/91  soft-tissue]
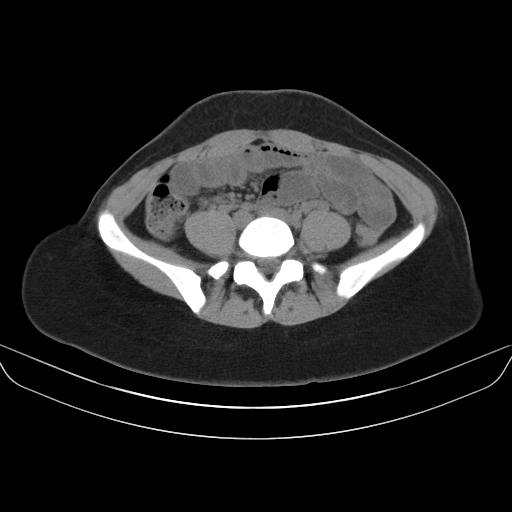
[im 46/91  soft-tissue]
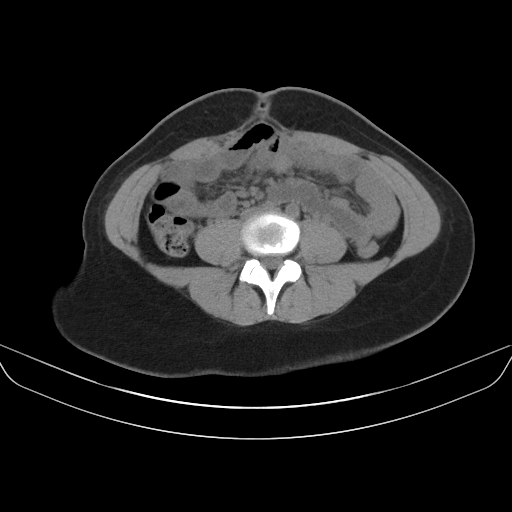
[im 51/91  soft-tissue]
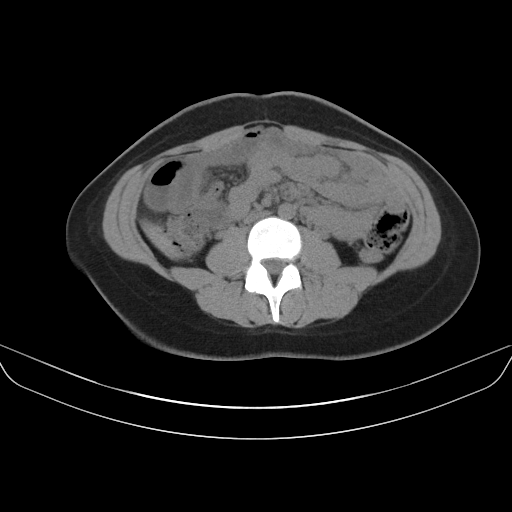
[im 61/91  soft-tissue]
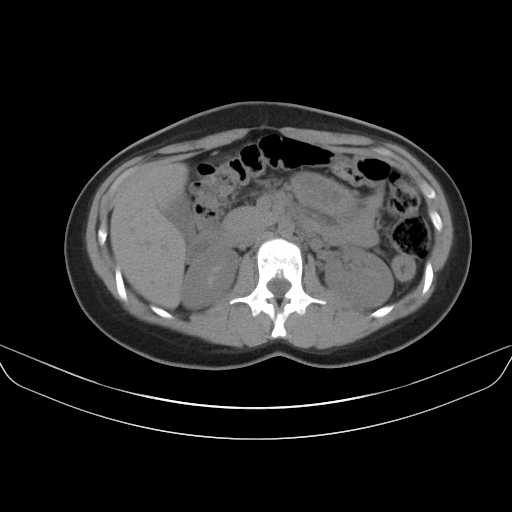
[im 61/91  bone]
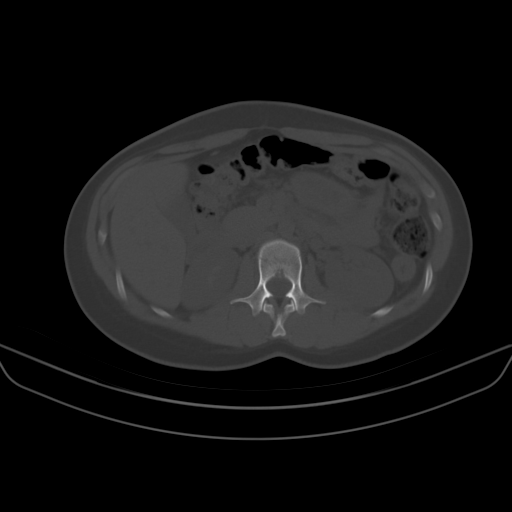
[im 66/91  soft-tissue]
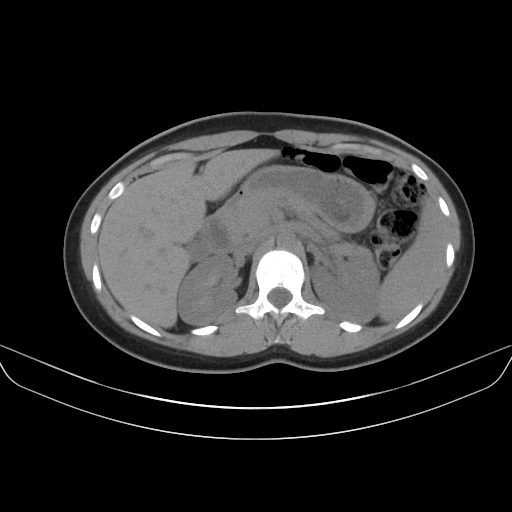
[im 71/91  soft-tissue]
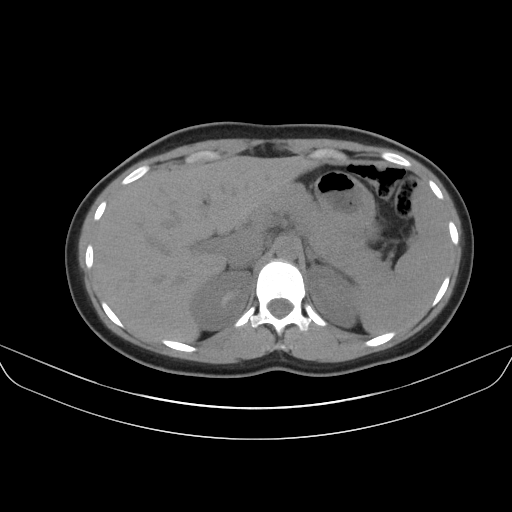
[im 71/91  lung]
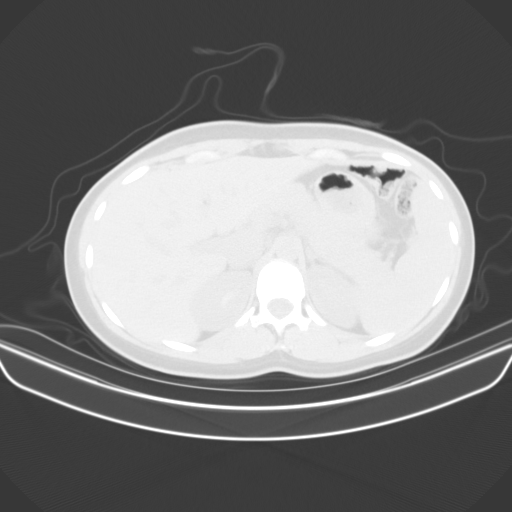
[im 76/91  lung]
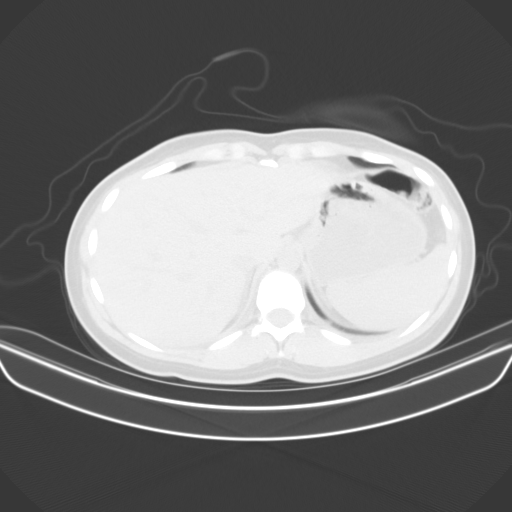
[im 81/91  soft-tissue]
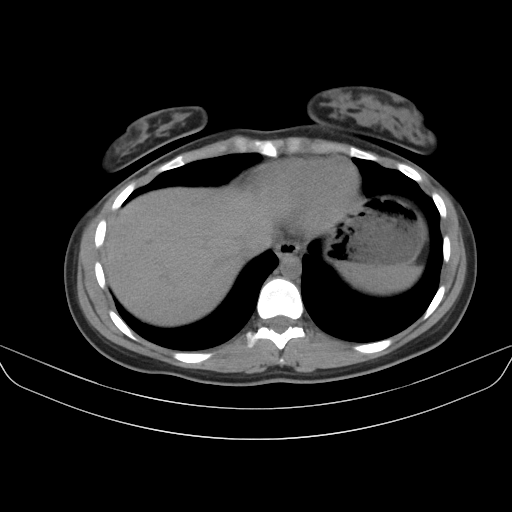
[im 81/91  lung]
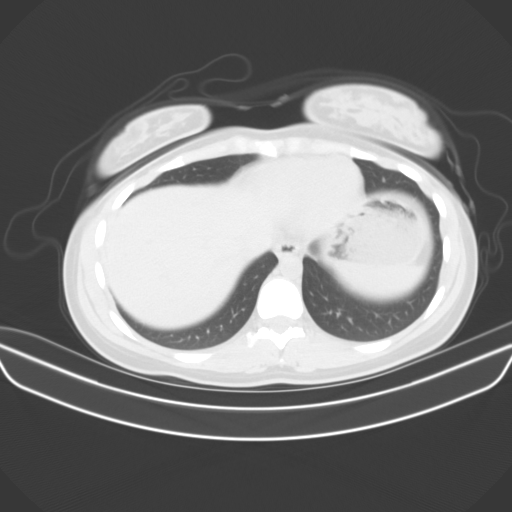
[im 86/91  soft-tissue]
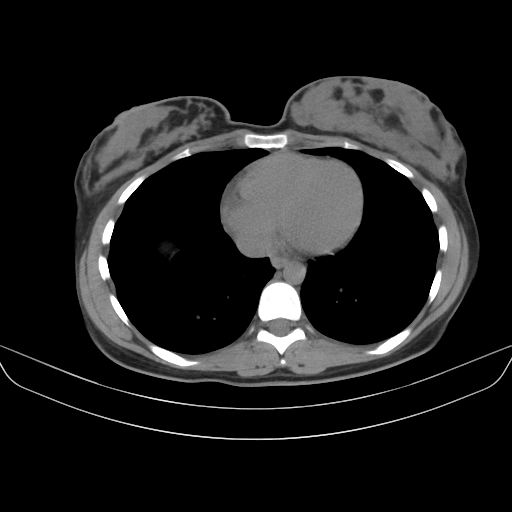
[im 86/91  lung]
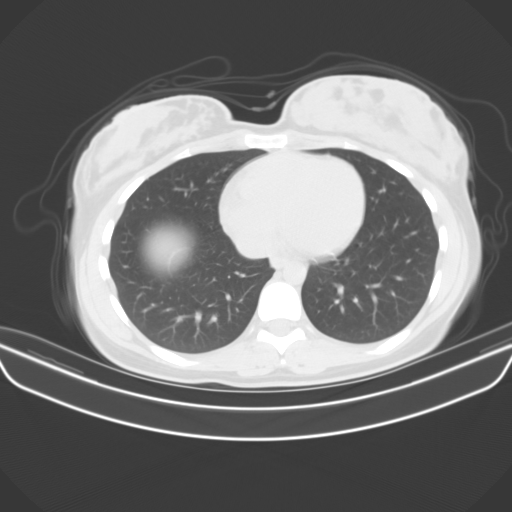

[14 of 32 positions shown; findings below may reference images not displayed]

FINDINGS: Lower chest: Unremarkable.

Hepatobiliary: No suspicious cystic or solid hepatic lesions
confidently identified on today's noncontrast CT examination.
Unenhanced appearance of the gallbladder is normal.

Pancreas: No definite pancreatic mass or peripancreatic fluid or
inflammatory changes are noted on today's noncontrast CT
examination.

Spleen: Unremarkable.

Adrenals/Urinary Tract: There are no abnormal calcifications within
the collecting system of either kidney, along the course of either
ureter, or within the lumen of the urinary bladder. No
hydroureteronephrosis or perinephric stranding to suggest urinary
tract obstruction at this time. The unenhanced appearance of the
kidneys is unremarkable bilaterally. Unenhanced appearance of the
urinary bladder is normal. Bilateral adrenal glands are normal in
appearance.

Stomach/Bowel: Normal appearance of the stomach. No pathologic
dilatation of small bowel or colon. Normal appendix.

Vascular/Lymphatic: No atherosclerotic calcifications in the
abdominal aorta or pelvic vasculature. No lymphadenopathy noted in
the abdomen or pelvis.

Reproductive: Uterus and ovaries are unremarkable in appearance.

Other: No significant volume of ascites.  No pneumoperitoneum.

Musculoskeletal: There are no aggressive appearing lytic or blastic
lesions noted in the visualized portions of the skeleton.
IMPRESSION: 1. No acute findings are noted in the abdomen or pelvis to account
for the patient's symptoms. Specifically, no urinary tract calculi
no findings of urinary tract obstruction are noted at this time.

## 2021-01-24 ENCOUNTER — Other Ambulatory Visit: Payer: Self-pay

## 2021-01-24 ENCOUNTER — Encounter (HOSPITAL_COMMUNITY): Payer: Self-pay | Admitting: Obstetrics and Gynecology

## 2021-01-24 ENCOUNTER — Inpatient Hospital Stay (HOSPITAL_COMMUNITY)
Admission: AD | Admit: 2021-01-24 | Discharge: 2021-01-24 | Disposition: A | Discharge status: Home / Self Care | Payer: BC Managed Care – PPO | Attending: Obstetrics and Gynecology | Admitting: Obstetrics and Gynecology

## 2021-01-24 DIAGNOSIS — Z3A08 8 weeks gestation of pregnancy: Secondary | ICD-10-CM | POA: Diagnosis not present

## 2021-01-24 DIAGNOSIS — O219 Vomiting of pregnancy, unspecified: Secondary | ICD-10-CM | POA: Diagnosis present

## 2021-01-24 LAB — URINALYSIS, ROUTINE W REFLEX MICROSCOPIC
Bilirubin Urine: NEGATIVE
Glucose, UA: NEGATIVE mg/dL
Hgb urine dipstick: NEGATIVE
Ketones, ur: 20 mg/dL — AB
Nitrite: NEGATIVE
Protein, ur: NEGATIVE mg/dL
Specific Gravity, Urine: 1.029 (ref 1.005–1.030)
pH: 5 (ref 5.0–8.0)

## 2021-01-24 LAB — POCT PREGNANCY, URINE: Preg Test, Ur: POSITIVE — AB

## 2021-01-24 MED ORDER — FAMOTIDINE 20 MG PO TABS: 20.0000 mg | ORAL_TABLET | Freq: Two times a day (BID) | ORAL | 0 refills | Status: DC

## 2021-01-24 MED ORDER — FAMOTIDINE IN NACL 20-0.9 MG/50ML-% IV SOLN
20.0000 mg | Freq: Once | INTRAVENOUS | Status: AC
Start: 2021-01-24 — End: 2021-01-24
  Administered 2021-01-24: 17:00:00 20 mg via INTRAVENOUS
  Filled 2021-01-24: qty 50

## 2021-01-24 MED ORDER — ONDANSETRON 8 MG PO TBDP: 8.0000 mg | ORAL_TABLET | Freq: Three times a day (TID) | ORAL | 1 refills | Status: DC | PRN

## 2021-01-24 MED ORDER — LACTATED RINGERS IV BOLUS
1000.0000 mL | Freq: Once | INTRAVENOUS | Status: AC
  Administered 2021-01-24: 16:00:00 1000 mL via INTRAVENOUS

## 2021-01-24 MED ORDER — ONDANSETRON HCL 4 MG/2ML IJ SOLN
4.0000 mg | Freq: Once | INTRAMUSCULAR | Status: AC
  Administered 2021-01-24: 16:00:00 4 mg via INTRAVENOUS
  Filled 2021-01-24: qty 2

## 2021-01-24 MED ORDER — PROMETHAZINE HCL 25 MG PO TABS: 25.0000 mg | ORAL_TABLET | Freq: Four times a day (QID) | ORAL | 1 refills | Status: DC | PRN

## 2021-01-24 NOTE — MAU Note (Signed)
Sharon Fuller is a 20 y.o. at [redacted]w[redacted]d here in MAU reporting: nausea for the past 2 weeks. This week started vomiting also. Today has been having trouble keeping down food and some liquids. States liquids stay down if she sips them. About 6 episodes of vomiting the past 24 hours. Has some pain after she vomits, but none at this time.  LMP: 11/23/2020  Onset of complaint: ongoing but worse  Pain score: 0/10  Vitals:   01/24/21 1546  BP: 115/63  Pulse: 84  Resp: 16  Temp: 99.2 F (37.3 C)  SpO2: 99%     Lab orders placed from triage: UA

## 2021-01-24 NOTE — Discharge Instructions (Signed)

## 2021-01-24 NOTE — MAU Provider Note (Signed)
History     CSN: [id number]  Arrival date and time: 01/24/21 1523   Event Date/Time   First Provider Initiated Contact with Patient 01/24/21 1617      Chief Complaint  Patient presents with   Nausea   HPI Sharon Fuller is a 20 y.o. G2P1001 at [redacted]w[redacted]d who presents with nausea and vomiting. She reports that she is only able to tolerate small sips of liquid. She reports she has vomited everything else that she eats. She reports 6 episodes of vomiting in the 24 hours. She denies any abdominal pain, vaginal bleeding or discharge.   OB History     Gravida  2   Para  1   Term  1   Preterm  0   AB  0   Living  1      SAB  0   IAB  0   Ectopic  0   Multiple  0   Live Births  1           Past Medical History:  Diagnosis Date   Allergy    History of chicken pox    Medical history non-contributory     Past Surgical History:  Procedure Laterality Date   NO PAST SURGERIES      Family History  Problem Relation Age of Onset   Kidney failure Mother    Breast cancer Maternal Grandmother    Kidney failure Maternal Grandfather    Asthma Father     Social History   Tobacco Use   Smoking status: Never   Smokeless tobacco: Never  Substance Use Topics   Alcohol use: Not Currently    Comment: not during pregnancy   Drug use: No    Allergies: No Known Allergies  No medications prior to admission.    Review of Systems  Constitutional: Negative.  Negative for fatigue and fever.  HENT: Negative.    Respiratory: Negative.  Negative for shortness of breath.   Cardiovascular: Negative.  Negative for chest pain.  Gastrointestinal:  Positive for nausea and vomiting. Negative for abdominal pain, constipation and diarrhea.  Genitourinary: Negative.  Negative for dysuria, vaginal bleeding and vaginal discharge.  Neurological: Negative.  Negative for dizziness and headaches.  Physical Exam   Blood pressure 115/63, pulse 84, temperature 99.2 F (37.3 C),  temperature source Oral, resp. rate 16, last menstrual period 11/23/2020, SpO2 99 %, unknown if currently breastfeeding.  Physical Exam Vitals and nursing note reviewed.  Constitutional:      General: She is not in acute distress.    Appearance: She is well-developed.  HENT:     Head: Normocephalic.  Eyes:     Pupils: Pupils are equal, round, and reactive to light.  Cardiovascular:     Rate and Rhythm: Normal rate and regular rhythm.     Heart sounds: Normal heart sounds.  Pulmonary:     Effort: Pulmonary effort is normal. No respiratory distress.     Breath sounds: Normal breath sounds.  Abdominal:     General: Bowel sounds are normal. There is no distension.     Palpations: Abdomen is soft.     Tenderness: There is no abdominal tenderness.  Skin:    General: Skin is warm and dry.  Neurological:     Mental Status: She is alert and oriented to person, place, and time.  Psychiatric:        Mood and Affect: Mood normal.        Behavior: Behavior normal.  Thought Content: Thought content normal.        Judgment: Judgment normal.    MAU Course  Procedures Results for orders placed or performed during the hospital encounter of 01/24/21 (from the past 24 hour(s))  Pregnancy, urine POC     Status: Abnormal   Collection Time: 01/24/21  3:30 PM  Result Value Ref Range   Preg Test, Ur POSITIVE (A) NEGATIVE  Urinalysis, Routine w reflex microscopic Urine, Clean Catch     Status: Abnormal   Collection Time: 01/24/21  3:53 PM  Result Value Ref Range   Color, Urine YELLOW YELLOW   APPearance CLOUDY (A) CLEAR   Specific Gravity, Urine 1.029 1.005 - 1.030   pH 5.0 5.0 - 8.0   Glucose, UA NEGATIVE NEGATIVE mg/dL   Hgb urine dipstick NEGATIVE NEGATIVE   Bilirubin Urine NEGATIVE NEGATIVE   Ketones, ur 20 (A) NEGATIVE mg/dL   Protein, ur NEGATIVE NEGATIVE mg/dL   Nitrite NEGATIVE NEGATIVE   Leukocytes,Ua SMALL (A) NEGATIVE   RBC / HPF 0-5 0 - 5 RBC/hpf   WBC, UA 6-10 0 - 5  WBC/hpf   Bacteria, UA RARE (A) NONE SEEN   Squamous Epithelial / LPF 11-20 0 - 5   Mucus PRESENT     MDM UA, UPT LR bolus  Zofran IV Pepcid IV  Assessment and Plan   1. Nausea/vomiting in pregnancy   2. [redacted] weeks gestation of pregnancy    -Discharge home in stable condition -Rx for zofran, pepcid, and phenergan sent to patient's pharmacy -First trimester precautions discussed -Patient advised to follow-up with OB as scheduled for prenatal care -Patient may return to MAU as needed or if her condition were to change or worsen   Rolm Bookbinder CNM 01/24/2021, 4:18 PM

## 2021-01-26 NOTE — L&D Delivery Note (Signed)
Vaginal Delivery Note  Patient pushed for less than 10 minutes after she was noted to be C/C/+2. Guided pushing with maternal urge and regular contractions. At 11:48 AM a viable and healthy female was delivered via Vaginal, Spontaneous (Presentation: Right Occiput Posterior).  APGAR: 9, 9 was delivered over intact perineum. After head was delivered, shoulders and body easily delivered.   Baby laid on maternal abdomen, bulb suction, drying and tactile stimulation performed. Baby noted to have a vigorous cry and moving all four extremities.  Delayed cord clamping done and cord cut by father. Cord blood obtained.   Placenta spontaneously delivered intact with trailing membranes. Uterine atony alleviated by IV pitocin and massage.  There were no lacerations noted upon inspection of the cervix, vagina and perineum.   Patient tolerated delivery well, there were no complications.    Delivery Details: Delivery Type: NSVD  Anesthesia Epidural [254]   Episiotomy: None [1]    Lacerations: None [1]    Repair suture: N/A  Blood loss (ml): 238   Birth information: Date of birth:  [birth year]  Time of birth:   11:48  Sex:   female  Name:  "Sharon Fuller"  APGAR APGAR (1 MIN): 9   APGAR (5 MINS): 9     Weight    Resuscitation:     Drying, stimulation, bulb suction  Cord information: 3 vessel cord    Complications:     None   Placenta: Delivered: Spontaneous intact appearance: Normal    Disposition: Mom to postpartum.  Baby to Couplet care / Skin to Skin.  Essie Hart MD 09/10/2021, 12:28 PM

## 2021-02-04 ENCOUNTER — Other Ambulatory Visit: Payer: Self-pay

## 2021-02-12 ENCOUNTER — Inpatient Hospital Stay (HOSPITAL_COMMUNITY)
Admission: AD | Admit: 2021-02-12 | Discharge: 2021-02-12 | Disposition: A | Discharge status: Home / Self Care | Payer: BC Managed Care – PPO | Attending: Obstetrics and Gynecology | Admitting: Obstetrics and Gynecology

## 2021-02-12 ENCOUNTER — Other Ambulatory Visit: Payer: Self-pay

## 2021-02-12 ENCOUNTER — Encounter (HOSPITAL_COMMUNITY): Payer: Self-pay | Admitting: Obstetrics and Gynecology

## 2021-02-12 DIAGNOSIS — W19XXXA Unspecified fall, initial encounter: Secondary | ICD-10-CM | POA: Diagnosis not present

## 2021-02-12 DIAGNOSIS — R1030 Lower abdominal pain, unspecified: Secondary | ICD-10-CM | POA: Diagnosis not present

## 2021-02-12 DIAGNOSIS — M25519 Pain in unspecified shoulder: Secondary | ICD-10-CM | POA: Diagnosis not present

## 2021-02-12 DIAGNOSIS — Z3A11 11 weeks gestation of pregnancy: Secondary | ICD-10-CM | POA: Diagnosis not present

## 2021-02-12 DIAGNOSIS — O26891 Other specified pregnancy related conditions, first trimester: Secondary | ICD-10-CM | POA: Diagnosis present

## 2021-02-12 NOTE — Discharge Instructions (Signed)
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
## 2021-02-19 LAB — OB RESULTS CONSOLE RPR: RPR: NONREACTIVE

## 2021-02-19 LAB — OB RESULTS CONSOLE GC/CHLAMYDIA
Chlamydia: NEGATIVE
Neisseria Gonorrhea: NEGATIVE

## 2021-02-19 LAB — OB RESULTS CONSOLE HEPATITIS B SURFACE ANTIGEN: Hepatitis B Surface Ag: NEGATIVE

## 2021-02-19 LAB — HEPATITIS C ANTIBODY: HCV Ab: NEGATIVE

## 2021-02-19 LAB — OB RESULTS CONSOLE RUBELLA ANTIBODY, IGM: Rubella: IMMUNE

## 2021-02-19 LAB — OB RESULTS CONSOLE HIV ANTIBODY (ROUTINE TESTING): HIV: NONREACTIVE

## 2021-02-26 ENCOUNTER — Other Ambulatory Visit: Payer: Self-pay

## 2021-02-26 ENCOUNTER — Inpatient Hospital Stay (HOSPITAL_COMMUNITY)
Admission: AD | Admit: 2021-02-26 | Discharge: 2021-02-26 | Disposition: A | Discharge status: Home / Self Care | Payer: BC Managed Care – PPO | Attending: Obstetrics and Gynecology | Admitting: Obstetrics and Gynecology

## 2021-02-26 DIAGNOSIS — O210 Mild hyperemesis gravidarum: Secondary | ICD-10-CM | POA: Diagnosis present

## 2021-02-26 DIAGNOSIS — E860 Dehydration: Secondary | ICD-10-CM

## 2021-02-26 DIAGNOSIS — O211 Hyperemesis gravidarum with metabolic disturbance: Secondary | ICD-10-CM | POA: Insufficient documentation

## 2021-02-26 DIAGNOSIS — Z3A13 13 weeks gestation of pregnancy: Secondary | ICD-10-CM | POA: Diagnosis not present

## 2021-02-26 DIAGNOSIS — O219 Vomiting of pregnancy, unspecified: Secondary | ICD-10-CM

## 2021-02-26 LAB — URINALYSIS, ROUTINE W REFLEX MICROSCOPIC
Bilirubin Urine: NEGATIVE
Glucose, UA: NEGATIVE mg/dL
Hgb urine dipstick: NEGATIVE
Ketones, ur: >80 mg/dL — AB
Leukocytes,Ua: NEGATIVE
Nitrite: NEGATIVE
Protein, ur: NEGATIVE mg/dL
Specific Gravity, Urine: >1.03 — ABNORMAL HIGH (ref 1.005–1.030)
pH: 6.5 (ref 5.0–8.0)

## 2021-02-26 MED ORDER — ONDANSETRON 8 MG PO TBDP: 8.0000 mg | ORAL_TABLET | Freq: Three times a day (TID) | ORAL | 2 refills | Status: DC | PRN

## 2021-02-26 MED ORDER — PROMETHAZINE HCL 25 MG PO TABS: 12.5000 mg | ORAL_TABLET | Freq: Four times a day (QID) | ORAL | 1 refills | Status: DC | PRN

## 2021-02-26 MED ORDER — PROMETHAZINE HCL 25 MG PO TABS: 12.5000 mg | ORAL_TABLET | Freq: Four times a day (QID) | ORAL | 2 refills | Status: DC | PRN

## 2021-02-26 MED ORDER — ONDANSETRON 4 MG PO TBDP
8.0000 mg | ORAL_TABLET | Freq: Once | ORAL | Status: AC
  Administered 2021-02-26: 8 mg via ORAL
  Filled 2021-02-26: qty 2

## 2021-02-26 NOTE — MAU Note (Addendum)
...  Sharon Fuller is a 21 y.o. at [redacted]w[redacted]d here in MAU reporting: Nausea throughout her entire pregnancy but she states she has been taking phenergan as needed but is now out of the medication as of yesterday. She states she threw up last night after taking the phenergan but has not vomited since. She states she is currently nauseous and has not eaten anything today. No VB or abnormal discharge.   Pain score: Denies pain  FHT: 150 doppler Lab orders placed from triage:  UA

## 2021-02-26 NOTE — MAU Provider Note (Signed)
Chief Complaint: Nausea   Event Date/Time   First Provider Initiated Contact with Patient 02/26/21 1522      SUBJECTIVE HPI: Sharon Fuller is a 21 y.o. G2P1001 at [redacted]w[redacted]d by LMP who presents to maternity admissions reporting nausea and vomiting x 2 days. She had n/v in early pregnancy and was taking Zofran and Phenergan daily. She was feeling better at 12 weeks so she stopped all of her medicine. She felt fine over the weekend but on 02/24/21, started having n/v again. She took Phenergan at 8 pm last night but threw it up immediately so has not had any other medicines. She has not eaten anything today and has only had sips of fluid, which have stayed down.   She denies vaginal bleeding, abdominal pain, vaginal itching/burning, urinary symptoms, h/a, dizziness, or fever/chills.     HPI  Past Medical History:  Diagnosis Date   Allergy    History of chicken pox    Medical history non-contributory    Past Surgical History:  Procedure Laterality Date   NO PAST SURGERIES     WISDOM TOOTH EXTRACTION     Social History   Socioeconomic History   Marital status: Single    Spouse name: Not on file   Number of children: Not on file   Years of education: Not on file   Highest education level: Not on file  Occupational History   Not on file  Tobacco Use   Smoking status: Never   Smokeless tobacco: Never  Substance and Sexual Activity   Alcohol use: Not Currently    Comment: not during pregnancy   Drug use: No   Sexual activity: Yes    Birth control/protection: None  Other Topics Concern   Not on file  Social History Narrative   Not on file   Social Determinants of Health   Financial Resource Strain: Not on file  Food Insecurity: Not on file  Transportation Needs: Not on file  Physical Activity: Not on file  Stress: Not on file  Social Connections: Not on file  Intimate Partner Violence: Not on file   No current facility-administered medications on file prior to encounter.    Current Outpatient Medications on File Prior to Encounter  Medication Sig Dispense Refill   famotidine (PEPCID) 20 MG tablet Take 1 tablet (20 mg total) by mouth 2 (two) times daily. 30 tablet 0   Prenatal Vit-Fe Fumarate-FA (PRENATAL MULTIVITAMIN) TABS tablet Take 1 tablet by mouth daily at 12 noon.     No Known Allergies  ROS:  Review of Systems  Constitutional:  Negative for chills, fatigue and fever.  Respiratory:  Negative for shortness of breath.   Cardiovascular:  Negative for chest pain.  Gastrointestinal:  Positive for nausea and vomiting.  Genitourinary:  Negative for difficulty urinating, dysuria, flank pain, pelvic pain, vaginal bleeding, vaginal discharge and vaginal pain.  Neurological:  Negative for dizziness and headaches.  Psychiatric/Behavioral: Negative.      I have reviewed patient's Past Medical Hx, Surgical Hx, Family Hx, Social Hx, medications and allergies.   Physical Exam  Patient Vitals for the past 24 hrs:  BP Temp Temp src Resp SpO2 Height Weight  02/26/21 1403 117/69 98.5 F (36.9 C) Oral 19 99 % 5\' 3"  (1.6 m) 59.9 kg   Constitutional: Well-developed, well-nourished female in no acute distress.  Cardiovascular: normal rate Respiratory: normal effort GI: Abd soft, non-tender. Pos BS x 4 MS: Extremities nontender, no edema, normal ROM Neurologic: Alert and oriented x  4.  GU: Neg CVAT.  PELVIC EXAM: Deferred  FHT 150 by doppler  LAB RESULTS Results for orders placed or performed during the hospital encounter of 02/26/21 (from the past 24 hour(s))  Urinalysis, Routine w reflex microscopic Urine, Clean Catch     Status: Abnormal   Collection Time: 02/26/21  1:42 PM  Result Value Ref Range   Color, Urine YELLOW YELLOW   APPearance CLEAR CLEAR   Specific Gravity, Urine >1.030 (H) 1.005 - 1.030   pH 6.5 5.0 - 8.0   Glucose, UA NEGATIVE NEGATIVE mg/dL   Hgb urine dipstick NEGATIVE NEGATIVE   Bilirubin Urine NEGATIVE NEGATIVE   Ketones, ur >80  (A) NEGATIVE mg/dL   Protein, ur NEGATIVE NEGATIVE mg/dL   Nitrite NEGATIVE NEGATIVE   Leukocytes,Ua NEGATIVE NEGATIVE       IMAGING No results found.  MAU Management/MDM: Orders Placed This Encounter  Procedures   Urinalysis, Routine w reflex microscopic   Discharge patient    Meds ordered this encounter  Medications   ondansetron (ZOFRAN-ODT) disintegrating tablet 8 mg   ondansetron (ZOFRAN-ODT) 8 MG disintegrating tablet    Sig: Take 1 tablet (8 mg total) by mouth every 8 (eight) hours as needed for nausea or vomiting.    Dispense:  30 tablet    Refill:  2    Order Specific Question:   Supervising Provider    Answer:   Mariel AloeBASS, LAWRENCE A [[id number]]   promethazine (PHENERGAN) 25 MG tablet    Sig: Take 0.5-1 tablets (12.5-25 mg total) by mouth every 6 (six) hours as needed for nausea or vomiting. You can place tablets vaginally if you cannot tolerate swallowing the pills.    Dispense:  30 tablet    Refill:  1    Order Specific Question:   Supervising Provider    Answer:   Mariel AloeBASS, LAWRENCE A [[id number]]    Pt well appearing, but UA indicates dehydration with 80+ ketones. Pt waiting in lobby for MAU room to be available for IV fluids.  Offered pt option of taking Zofran ODT in MAU, trying PO fluids, and discharge with prescriptions for antiemetics.  Pt desires to take Zofran and be discharged from MAU.  Zofran 8 mg ODT given. Rx for Zofran, Phenergan renewed.  Pt to f/u with her OB/Gyn.  Return precautions to MAU reviewed.    ASSESSMENT 1. Nausea and vomiting during pregnancy prior to [redacted] weeks gestation   2. Mild dehydration   3. [redacted] weeks gestation of pregnancy     PLAN Discharge home Allergies as of 02/26/2021   No Known Allergies      Medication List     TAKE these medications    famotidine 20 MG tablet Commonly known as: PEPCID Take 1 tablet (20 mg total) by mouth 2 (two) times daily.   ondansetron 8 MG disintegrating tablet Commonly known as: ZOFRAN-ODT Take 1  tablet (8 mg total) by mouth every 8 (eight) hours as needed for nausea or vomiting.   prenatal multivitamin Tabs tablet Take 1 tablet by mouth daily at 12 noon.   promethazine 25 MG tablet Commonly known as: PHENERGAN Take 0.5-1 tablets (12.5-25 mg total) by mouth every 6 (six) hours as needed for nausea or vomiting. You can place tablets vaginally if you cannot tolerate swallowing the pills. What changed:  how much to take additional instructions        Follow-up Information     [facility].   Specialty: Obstetrics and  Gynecology Why: As scheduled Contact information: [address] [place] [address] [phone]        [facility] Follow up.   Specialty: Obstetrics and Gynecology Why: As needed for emergencies Contact information: [address] [id number] Wilhemina Bonito [place] [address] [phone]                Sharen Counter Certified Nurse-Midwife 02/26/2021  4:22 PM

## 2021-02-27 ENCOUNTER — Other Ambulatory Visit: Payer: Self-pay

## 2021-02-27 ENCOUNTER — Encounter (HOSPITAL_COMMUNITY): Payer: Self-pay | Admitting: Obstetrics & Gynecology

## 2021-02-27 ENCOUNTER — Inpatient Hospital Stay (HOSPITAL_COMMUNITY)
Admission: AD | Admit: 2021-02-27 | Discharge: 2021-02-27 | Disposition: A | Discharge status: Home / Self Care | Payer: BC Managed Care – PPO | Attending: Obstetrics & Gynecology | Admitting: Obstetrics & Gynecology

## 2021-02-27 DIAGNOSIS — Z3A13 13 weeks gestation of pregnancy: Secondary | ICD-10-CM | POA: Diagnosis not present

## 2021-02-27 DIAGNOSIS — E860 Dehydration: Secondary | ICD-10-CM | POA: Insufficient documentation

## 2021-02-27 DIAGNOSIS — O99281 Endocrine, nutritional and metabolic diseases complicating pregnancy, first trimester: Secondary | ICD-10-CM | POA: Insufficient documentation

## 2021-02-27 DIAGNOSIS — O219 Vomiting of pregnancy, unspecified: Secondary | ICD-10-CM | POA: Diagnosis present

## 2021-02-27 LAB — COMPREHENSIVE METABOLIC PANEL
ALT: 16 U/L (ref 0–44)
AST: 18 U/L (ref 15–41)
Albumin: 3.8 g/dL (ref 3.5–5.0)
Alkaline Phosphatase: 35 U/L — ABNORMAL LOW (ref 38–126)
Anion gap: 8 (ref 5–15)
BUN: 10 mg/dL (ref 6–20)
CO2: 23 mmol/L (ref 22–32)
Calcium: 9.1 mg/dL (ref 8.9–10.3)
Chloride: 104 mmol/L (ref 98–111)
Creatinine, Ser: 0.62 mg/dL (ref 0.44–1.00)
GFR, Estimated: >60 mL/min (ref >60)
Glucose, Bld: 83 mg/dL (ref 70–99)
Potassium: 3.7 mmol/L (ref 3.5–5.1)
Sodium: 135 mmol/L (ref 135–145)
Total Bilirubin: 0.8 mg/dL (ref 0.3–1.2)
Total Protein: 7 g/dL (ref 6.5–8.1)

## 2021-02-27 LAB — URINALYSIS, ROUTINE W REFLEX MICROSCOPIC
Bilirubin Urine: NEGATIVE
Glucose, UA: NEGATIVE mg/dL
Hgb urine dipstick: NEGATIVE
Ketones, ur: 15 mg/dL — AB
Leukocytes,Ua: NEGATIVE
Nitrite: NEGATIVE
Protein, ur: NEGATIVE mg/dL
Specific Gravity, Urine: >1.03 — ABNORMAL HIGH (ref 1.005–1.030)
pH: 6 (ref 5.0–8.0)

## 2021-02-27 LAB — CBC
HCT: 32.2 % — ABNORMAL LOW (ref 36.0–46.0)
Hemoglobin: 11.3 g/dL — ABNORMAL LOW (ref 12.0–15.0)
MCH: 31 pg (ref 26.0–34.0)
MCHC: 35.1 g/dL (ref 30.0–36.0)
MCV: 88.2 fL (ref 80.0–100.0)
Platelets: 195 10*3/uL (ref 150–400)
RBC: 3.65 MIL/uL — ABNORMAL LOW (ref 3.87–5.11)
RDW: 12.1 % (ref 11.5–15.5)
WBC: 4.9 10*3/uL (ref 4.0–10.5)
nRBC: 0 % (ref 0.0–0.2)

## 2021-02-27 MED ORDER — SODIUM CHLORIDE 0.9 % IV SOLN
12.5000 mg | Freq: Once | INTRAVENOUS | Status: AC
  Administered 2021-02-27: 12.5 mg via INTRAVENOUS
  Filled 2021-02-27: qty 12.5

## 2021-02-27 MED ORDER — LACTATED RINGERS IV SOLN: Freq: Once | INTRAVENOUS | Status: AC

## 2021-02-27 MED ORDER — SCOPOLAMINE 1 MG/3DAYS TD PT72: 1.0000 | MEDICATED_PATCH | TRANSDERMAL | 12 refills | Status: DC

## 2021-02-27 MED ORDER — SCOPOLAMINE 1 MG/3DAYS TD PT72
1.0000 | MEDICATED_PATCH | TRANSDERMAL | Status: DC
  Administered 2021-02-27: 1.5 mg via TRANSDERMAL
  Filled 2021-02-27: qty 1

## 2021-02-27 NOTE — MAU Provider Note (Signed)
Chief Complaint: Emesis   Event Date/Time   First Provider Initiated Contact with Patient 02/27/21 2208        SUBJECTIVE HPI: Sharon Fuller is a 21 y.o. G2P1001 at [redacted]w[redacted]d by LMP who presents to maternity admissions reporting nausea and vomiting since Tuesday.  Was seen yesterday and sent home with Rx for meds, but did not get them filled "because I didn't have time"   Was working.  She denies vaginal bleeding, vaginal itching/burning, urinary symptoms, h/a, dizziness, or fever/chills.    Emesis  This is a recurrent problem. The current episode started in the past 7 days. The problem has been unchanged. There has been no fever. Pertinent negatives include no abdominal pain, chest pain, chills, coughing, diarrhea, dizziness or fever. She has tried nothing for the symptoms.   RN Note: PT SAYS SHE HAS BEEN VOMITING Tuesday-  WAS HERE YESTERDAY -MEDS,  BACK FOR VOMITING  [facility]- WITH [facility]  GAVE RX- BUT DIDN'T GO FILL IT   Past Medical History:  Diagnosis Date   Allergy    History of chicken pox    Medical history non-contributory    Past Surgical History:  Procedure Laterality Date   NO PAST SURGERIES     WISDOM TOOTH EXTRACTION     Social History   Socioeconomic History   Marital status: Single    Spouse name: Not on file   Number of children: Not on file   Years of education: Not on file   Highest education level: Not on file  Occupational History   Not on file  Tobacco Use   Smoking status: Never   Smokeless tobacco: Never  Substance and Sexual Activity   Alcohol use: Not Currently    Comment: not during pregnancy   Drug use: No   Sexual activity: Yes    Birth control/protection: None  Other Topics Concern   Not on file  Social History Narrative   Not on file   Social Determinants of Health   Financial Resource Strain: Not on file  Food Insecurity: Not on file  Transportation Needs: Not on file  Physical Activity: Not on file  Stress: Not on file  Social  Connections: Not on file  Intimate Partner Violence: Not on file   No current facility-administered medications on file prior to encounter.   Current Outpatient Medications on File Prior to Encounter  Medication Sig Dispense Refill   ondansetron (ZOFRAN-ODT) 8 MG disintegrating tablet Take 1 tablet (8 mg total) by mouth every 8 (eight) hours as needed for nausea or vomiting. 30 tablet 2   famotidine (PEPCID) 20 MG tablet Take 1 tablet (20 mg total) by mouth 2 (two) times daily. 30 tablet 0   Prenatal Vit-Fe Fumarate-FA (PRENATAL MULTIVITAMIN) TABS tablet Take 1 tablet by mouth daily at 12 noon.     promethazine (PHENERGAN) 25 MG tablet Take 0.5-1 tablets (12.5-25 mg total) by mouth every 6 (six) hours as needed for nausea or vomiting. You can place tablets vaginally if you cannot tolerate swallowing the pills. 30 tablet 2   No Known Allergies  I have reviewed patient's Past Medical Hx, Surgical Hx, Family Hx, Social Hx, medications and allergies.   ROS:  Review of Systems  Constitutional:  Negative for chills and fever.  Respiratory:  Negative for cough.   Cardiovascular:  Negative for chest pain.  Gastrointestinal:  Positive for vomiting. Negative for abdominal pain and diarrhea.  Neurological:  Negative for dizziness.  Review of Systems  Other systems negative  Physical Exam  Physical Exam Patient Vitals for the past 24 hrs:  BP Temp Temp src Pulse Resp Height Weight  02/27/21 2150 106/61 98.2 F (36.8 C) Oral 89 20 5\' 3"  (1.6 m) 59.5 kg   Constitutional: Well-developed, well-nourished female in no acute distress.  Cardiovascular: normal rate Respiratory: normal effort GI: Abd soft, non-tender. Pos BS x 4 MS: Extremities nontender, no edema, normal ROM Neurologic: Alert and oriented x 4.  GU: Neg CVAT.  FHT 160 by doppler  LAB RESULTS  Results for orders placed or performed during the hospital encounter of 02/27/21 (from the past 24 hour(s))  Urinalysis, Routine w  reflex microscopic Urine, Clean Catch     Status: Abnormal   Collection Time: 02/27/21 10:02 PM  Result Value Ref Range   Color, Urine YELLOW YELLOW   APPearance HAZY (A) CLEAR   Specific Gravity, Urine >1.030 (H) 1.005 - 1.030   pH 6.0 5.0 - 8.0   Glucose, UA NEGATIVE NEGATIVE mg/dL   Hgb urine dipstick NEGATIVE NEGATIVE   Bilirubin Urine NEGATIVE NEGATIVE   Ketones, ur 15 (A) NEGATIVE mg/dL   Protein, ur NEGATIVE NEGATIVE mg/dL   Nitrite NEGATIVE NEGATIVE   Leukocytes,Ua NEGATIVE NEGATIVE  CBC     Status: Abnormal   Collection Time: 02/27/21 10:27 PM  Result Value Ref Range   WBC 4.9 4.0 - 10.5 K/uL   RBC 3.65 (L) 3.87 - 5.11 MIL/uL   Hemoglobin 11.3 (L) 12.0 - 15.0 g/dL   HCT 04/27/21 (L) 74.8 - 27.0 %   MCV 88.2 80.0 - 100.0 fL   MCH 31.0 26.0 - 34.0 pg   MCHC 35.1 30.0 - 36.0 g/dL   RDW 78.6 75.4 - 49.2 %   Platelets 195 150 - 400 K/uL   nRBC 0.0 0.0 - 0.2 %  Comprehensive metabolic panel     Status: Abnormal   Collection Time: 02/27/21 10:27 PM  Result Value Ref Range   Sodium 135 135 - 145 mmol/L   Potassium 3.7 3.5 - 5.1 mmol/L   Chloride 104 98 - 111 mmol/L   CO2 23 22 - 32 mmol/L   Glucose, Bld 83 70 - 99 mg/dL   BUN 10 6 - 20 mg/dL   Creatinine, Ser 04/27/21 0.44 - 1.00 mg/dL   Calcium 9.1 8.9 - 0.71 mg/dL   Total Protein 7.0 6.5 - 8.1 g/dL   Albumin 3.8 3.5 - 5.0 g/dL   AST 18 15 - 41 U/L   ALT 16 0 - 44 U/L   Alkaline Phosphatase 35 (L) 38 - 126 U/L   Total Bilirubin 0.8 0.3 - 1.2 mg/dL   GFR, Estimated 21.9 >75 mL/min   Anion gap 8 5 - 15     IMAGING No results found.  MAU Management/MDM: Ordered labs to evaluate for metabolic imbalance. These are normal .   IV fluids ordered .We gave her Promethazine and Scopolamine patch for nausea These did relieve her nausea  After infusion, patient stated she felt better and wanted to go home   ASSESSMENT Single IUP at.[redacted]w[redacted]d Nausea and vomiting Dehydration  PLAN Discharge home Advised to fill prescriptions  and use meds New Rx for scopolamine patch sent, advised to keep this one on for 72 hrs Advance diet as tolerated  Pt stable at time of discharge. Encouraged to return here if she develops worsening of symptoms, increase in pain, fever, or other concerning symptoms.    [redacted]w[redacted]d CNM, MSN Certified Nurse-Midwife 02/27/2021  10:08 PM

## 2021-02-27 NOTE — MAU Note (Signed)
PT SAYS SHE HAS BEEN VOMITING Tuesday-  WAS HERE YESTERDAY -MEDS,  BACK FOR VOMITING  [facility]- WITH [facility]  GAVE RX- BUT DIDN'T GO FILL IT

## 2021-05-13 ENCOUNTER — Ambulatory Visit (HOSPITAL_COMMUNITY)
Admission: EM | Admit: 2021-05-13 | Discharge: 2021-05-13 | Disposition: A | Discharge status: Home / Self Care | Payer: BC Managed Care – PPO

## 2021-05-13 DIAGNOSIS — N949 Unspecified condition associated with female genital organs and menstrual cycle: Secondary | ICD-10-CM

## 2021-05-13 NOTE — ED Provider Notes (Signed)
?[facility] ? ? ? ?CSN: [id number] ?Arrival date & time: 05/13/21  1148 ? ? ?  ? ?History   ?Chief Complaint ?Chief Complaint  ?Patient presents with  ? Hip Pain  ? ? ?HPI ?Sharon Fuller is a 21 y.o. female.  ? ?Presents with moderate pain in her right hip that has been ongoing and worsening the past couple of days.  She reports that she moves a certain way or lays a certain way, she can feel the pain intensify.  She denies any numbness or tingling going down her leg.  No recent fall, accident, injury, or trauma.  She denies fevers, nausea/vomiting.  She has tried stretching for the pain, however is not taking any medicine.  She is 5 months pregnant. ? ? ?Past Medical History:  ?Diagnosis Date  ? Allergy   ? History of chicken pox   ? Medical history non-contributory   ? ? ?There are no problems to display for this patient. ? ? ?Past Surgical History:  ?Procedure Laterality Date  ? NO PAST SURGERIES    ? WISDOM TOOTH EXTRACTION    ? ? ?OB History   ? ? Gravida  ?2  ? Para  ?1  ? Term  ?1  ? Preterm  ?0  ? AB  ?0  ? Living  ?1  ?  ? ? SAB  ?0  ? IAB  ?0  ? Ectopic  ?0  ? Multiple  ?0  ? Live Births  ?1  ?   ?  ?  ? ? ? ?Home Medications   ? ?Prior to Admission medications   ?Medication Sig Start Date End Date Taking? Authorizing Provider  ?famotidine (PEPCID) 20 MG tablet Take 1 tablet (20 mg total) by mouth 2 (two) times daily. 01/24/21   Rolm Bookbinder, CNM  ?ondansetron (ZOFRAN-ODT) 8 MG disintegrating tablet Take 1 tablet (8 mg total) by mouth every 8 (eight) hours as needed for nausea or vomiting. 02/26/21   Leftwich-Kirby, Wilmer Floor, CNM  ?Prenatal Vit-Fe Fumarate-FA (PRENATAL MULTIVITAMIN) TABS tablet Take 1 tablet by mouth daily at 12 noon.    [provider]  ?promethazine (PHENERGAN) 25 MG tablet Take 0.5-1 tablets (12.5-25 mg total) by mouth every 6 (six) hours as needed for nausea or vomiting. You can place tablets vaginally if you cannot tolerate swallowing the pills. 02/26/21    Leftwich-Kirby, Wilmer Floor, CNM  ?scopolamine (TRANSDERM-SCOP) 1 MG/3DAYS Place 1 patch (1.5 mg total) onto the skin every 3 (three) days. 03/02/21   Aviva Signs, CNM  ? ? ?Family History ?Family History  ?Problem Relation Age of Onset  ? Kidney failure Mother   ? Breast cancer Maternal Grandmother   ? Kidney failure Maternal Grandfather   ? Asthma Father   ? ? ?Social History ?Social History  ? ?Tobacco Use  ? Smoking status: Never  ? Smokeless tobacco: Never  ?Substance Use Topics  ? Alcohol use: Not Currently  ?  Comment: not during pregnancy  ? Drug use: No  ? ? ? ?Allergies   ?Patient has no known allergies. ? ? ?Review of Systems ?Review of Systems ?Per HPI ? ?Physical Exam ?Triage Vital Signs ?ED Triage Vitals  ?Enc Vitals Group  ?   BP 05/13/21 1250 (!) 105/59  ?   Pulse Rate 05/13/21 1254 78  ?   Resp 05/13/21 1250 16  ?   Temp 05/13/21 1254 97.7 ?F (36.5 ?C)  ?   Temp Source 05/13/21 1254 Oral  ?  SpO2 05/13/21 1250 100 %  ?   Weight --   ?   Height --   ?   Head Circumference --   ?   Peak Flow --   ?   Pain Score --   ?   Pain Loc --   ?   Pain Edu? --   ?   Excl. in GC? --   ? ?No data found. ? ?Updated Vital Signs ?BP (!) 105/59 (BP Location: Left Arm)   Pulse 78   Temp 97.7 ?F (36.5 ?C) (Oral)   Resp 16   LMP 11/23/2020   SpO2 100%  ? ?Visual Acuity ?Right Eye Distance:   ?Left Eye Distance:   ?Bilateral Distance:   ? ?Right Eye Near:   ?Left Eye Near:    ?Bilateral Near:    ? ?Physical Exam ?Vitals and nursing note reviewed.  ?Constitutional:   ?   General: She is not in acute distress. ?   Appearance: Normal appearance. She is not toxic-appearing.  ?Pulmonary:  ?   Effort: Pulmonary effort is normal. No respiratory distress.  ?Musculoskeletal:  ?   Right hip: Tenderness present. No bony tenderness or crepitus. Normal range of motion. Normal strength.  ?   Right lower leg: No edema.  ?   Left lower leg: No edema.  ?   Right ankle: Normal pulse.  ?   Left ankle: Normal pulse.  ?   Right foot:  Normal pulse.  ?   Left foot: Normal pulse.  ?     Legs: ? ?   Comments: Tender to palpation to the lateral aspect of the right hip in the area marked  No obvious swelling, deformity, edema, warmth.  Full range of motion and strength.  ?Skin: ?   General: Skin is warm and dry.  ?   Capillary Refill: Capillary refill takes less than 2 seconds.  ?   Coloration: Skin is not jaundiced or pale.  ?   Findings: No erythema.  ?Neurological:  ?   Mental Status: She is alert and oriented to person, place, and time.  ?Psychiatric:     ?   Behavior: Behavior is cooperative.  ? ? ? ?UC Treatments / Results  ?Labs ?(all labs ordered are listed, but only abnormal results are displayed) ?Labs Reviewed - No data to display ? ?EKG ? ? ?Radiology ?No results found. ? ?Procedures ?Procedures (including critical care time) ? ?Medications Ordered in UC ?Medications - No data to display ? ?Initial Impression / Assessment and Plan / UC Course  ?I have reviewed the triage vital signs and the nursing notes. ? ?Pertinent labs & imaging results that were available during my care of the patient were reviewed by me and considered in my medical decision making (see chart for details). ? ?  ?Suspect round ligament pain.  Discussed use of Tylenol to help with pain if needed.  She can also use massage, ice/heat, stretches.  Discussed good footwear when active.  Follow-up with OB/GYN if symptoms persist. ?Final Clinical Impressions(s) / UC Diagnoses  ? ?Final diagnoses:  ?Round ligament pain  ? ? ? ?Discharge Instructions   ? ?  ?- You can use warm compresses/ice packs to help with the pain in your right hip muscle.   ?-Please use good supportive footwear when active ?-Follow-up with OB/GYN if your symptoms persist ? ? ? ? ?ED Prescriptions   ?None ?  ? ?PDMP not reviewed this encounter. ?  ?Valentino NoseMartinez, Belle Charlie A,  NP ?05/13/21 1334 ? ?

## 2021-05-13 NOTE — Discharge Instructions (Addendum)
-   You can use warm compresses/ice packs to help with the pain in your right hip muscle.   ?-Please use good supportive footwear when active ?-Follow-up with OB/GYN if your symptoms persist ?

## 2021-05-13 NOTE — ED Triage Notes (Signed)
Pt is 5 months pregnant and c/o hip pain that started over the weekend. She thinks she pull a muscle.  ?

## 2021-05-15 ENCOUNTER — Inpatient Hospital Stay (HOSPITAL_COMMUNITY)
Admission: AD | Admit: 2021-05-15 | Discharge: 2021-05-15 | Disposition: A | Discharge status: Home / Self Care | Payer: BC Managed Care – PPO | Attending: Obstetrics and Gynecology | Admitting: Obstetrics and Gynecology

## 2021-05-15 ENCOUNTER — Encounter (HOSPITAL_COMMUNITY): Payer: Self-pay | Admitting: Obstetrics and Gynecology

## 2021-05-15 DIAGNOSIS — D696 Thrombocytopenia, unspecified: Secondary | ICD-10-CM | POA: Diagnosis not present

## 2021-05-15 DIAGNOSIS — E860 Dehydration: Secondary | ICD-10-CM | POA: Diagnosis not present

## 2021-05-15 DIAGNOSIS — Z79899 Other long term (current) drug therapy: Secondary | ICD-10-CM | POA: Diagnosis not present

## 2021-05-15 DIAGNOSIS — O99119 Other diseases of the blood and blood-forming organs and certain disorders involving the immune mechanism complicating pregnancy, unspecified trimester: Secondary | ICD-10-CM

## 2021-05-15 DIAGNOSIS — Z3A24 24 weeks gestation of pregnancy: Secondary | ICD-10-CM | POA: Diagnosis not present

## 2021-05-15 DIAGNOSIS — O212 Late vomiting of pregnancy: Secondary | ICD-10-CM | POA: Insufficient documentation

## 2021-05-15 DIAGNOSIS — G44209 Tension-type headache, unspecified, not intractable: Secondary | ICD-10-CM | POA: Diagnosis not present

## 2021-05-15 DIAGNOSIS — O99012 Anemia complicating pregnancy, second trimester: Secondary | ICD-10-CM | POA: Insufficient documentation

## 2021-05-15 DIAGNOSIS — O99112 Other diseases of the blood and blood-forming organs and certain disorders involving the immune mechanism complicating pregnancy, second trimester: Secondary | ICD-10-CM | POA: Insufficient documentation

## 2021-05-15 DIAGNOSIS — O99282 Endocrine, nutritional and metabolic diseases complicating pregnancy, second trimester: Secondary | ICD-10-CM | POA: Insufficient documentation

## 2021-05-15 DIAGNOSIS — O99352 Diseases of the nervous system complicating pregnancy, second trimester: Secondary | ICD-10-CM | POA: Diagnosis not present

## 2021-05-15 LAB — BASIC METABOLIC PANEL
Anion gap: 7 (ref 5–15)
BUN: 6 mg/dL (ref 6–20)
CO2: 23 mmol/L (ref 22–32)
Calcium: 8.8 mg/dL — ABNORMAL LOW (ref 8.9–10.3)
Chloride: 107 mmol/L (ref 98–111)
Creatinine, Ser: 0.57 mg/dL (ref 0.44–1.00)
GFR, Estimated: >60 mL/min (ref >60)
Glucose, Bld: 80 mg/dL (ref 70–99)
Potassium: 3.7 mmol/L (ref 3.5–5.1)
Sodium: 137 mmol/L (ref 135–145)

## 2021-05-15 LAB — URINALYSIS, ROUTINE W REFLEX MICROSCOPIC
Bacteria, UA: NONE SEEN
Bilirubin Urine: NEGATIVE
Glucose, UA: NEGATIVE mg/dL
Hgb urine dipstick: NEGATIVE
Ketones, ur: 80 mg/dL — AB
Leukocytes,Ua: NEGATIVE
Nitrite: NEGATIVE
Protein, ur: 30 mg/dL — AB
Specific Gravity, Urine: 1.026 (ref 1.005–1.030)
pH: 6 (ref 5.0–8.0)

## 2021-05-15 LAB — CBC
HCT: 28.3 % — ABNORMAL LOW (ref 36.0–46.0)
Hemoglobin: 9.6 g/dL — ABNORMAL LOW (ref 12.0–15.0)
MCH: 31.8 pg (ref 26.0–34.0)
MCHC: 33.9 g/dL (ref 30.0–36.0)
MCV: 93.7 fL (ref 80.0–100.0)
Platelets: 134 10*3/uL — ABNORMAL LOW (ref 150–400)
RBC: 3.02 MIL/uL — ABNORMAL LOW (ref 3.87–5.11)
RDW: 12.8 % (ref 11.5–15.5)
WBC: 3.9 10*3/uL — ABNORMAL LOW (ref 4.0–10.5)
nRBC: 0 % (ref 0.0–0.2)

## 2021-05-15 MED ORDER — LACTATED RINGERS IV BOLUS
1000.0000 mL | Freq: Once | INTRAVENOUS | Status: AC
  Administered 2021-05-15: 1000 mL via INTRAVENOUS

## 2021-05-15 MED ORDER — DIPHENHYDRAMINE HCL 50 MG/ML IJ SOLN
25.0000 mg | Freq: Once | INTRAMUSCULAR | Status: AC
  Administered 2021-05-15: 25 mg via INTRAVENOUS
  Filled 2021-05-15: qty 1

## 2021-05-15 MED ORDER — POLYSACCHARIDE IRON COMPLEX 150 MG PO CAPS: 150.0000 mg | ORAL_CAPSULE | ORAL | 3 refills | Status: AC

## 2021-05-15 MED ORDER — METOCLOPRAMIDE HCL 5 MG/ML IJ SOLN
10.0000 mg | Freq: Once | INTRAMUSCULAR | Status: AC
  Administered 2021-05-15: 10 mg via INTRAVENOUS
  Filled 2021-05-15: qty 2

## 2021-05-15 MED ORDER — DEXAMETHASONE SODIUM PHOSPHATE 10 MG/ML IJ SOLN
10.0000 mg | Freq: Once | INTRAMUSCULAR | Status: AC
  Administered 2021-05-15: 10 mg via INTRAVENOUS
  Filled 2021-05-15: qty 1

## 2021-05-15 NOTE — MAU Note (Signed)
...  Sharon Fuller is a 21 y.o. at [redacted]w[redacted]d here in MAU reporting: HA since yesterday morning at 0600. She states she was vomiting yesterday every time she would try to eat and the last food she kept down was her breakfast. She states she has not taken anything for her headache or her vomiting. Has Phenergan at home but is out of the Zofran at home. Last emesis episode last night before going to bed. Also endorses lower back pain but states this is not new. +FM. No VB or LOF. ? ?Pain score:  ?8/10 HA ?3/10 back pain ?  ?FHT: 154 dopppler ?Lab orders placed from triage: UA ? ?

## 2021-05-15 NOTE — MAU Provider Note (Signed)
?History  ?  ? ?CSN: [id number] ? ?Arrival date and time: 05/15/21 0734 ? ? Event Date/Time  ? First Provider Initiated Contact with Patient 05/15/21 [phone]   ?  ? ?Chief Complaint  ?Patient presents with  ? Headache  ? Emesis  ? ?21 year old G2 P1-0-0-1 at 24.5 weeks presenting with headache.  Reports onset yesterday morning.  Headache is generalized.  She is light sensitive.  Has not treated.  No migraine history.  Associated symptoms are nausea vomiting, had 3 episodes.  Denies fevers.  She is tolerating small amounts of liquids today.  No pregnancy concerns.  Reports good fetal movement. ? ?OB History   ? ? Gravida  ?2  ? Para  ?1  ? Term  ?1  ? Preterm  ?0  ? AB  ?0  ? Living  ?1  ?  ? ? SAB  ?0  ? IAB  ?0  ? Ectopic  ?0  ? Multiple  ?0  ? Live Births  ?1  ?   ?  ?  ? ? ?Past Medical History:  ?Diagnosis Date  ? Allergy   ? History of chicken pox   ? Medical history non-contributory   ? ? ?Past Surgical History:  ?Procedure Laterality Date  ? NO PAST SURGERIES    ? WISDOM TOOTH EXTRACTION    ? ? ?Family History  ?Problem Relation Age of Onset  ? Kidney failure Mother   ? Breast cancer Maternal Grandmother   ? Kidney failure Maternal Grandfather   ? Asthma Father   ? ? ?Social History  ? ?Tobacco Use  ? Smoking status: Never  ? Smokeless tobacco: Never  ?Substance Use Topics  ? Alcohol use: Not Currently  ?  Comment: not during pregnancy  ? Drug use: No  ? ? ?Allergies: No Known Allergies ? ?Medications Prior to Admission  ?Medication Sig Dispense Refill Last Dose  ? famotidine (PEPCID) 20 MG tablet Take 1 tablet (20 mg total) by mouth 2 (two) times daily. 30 tablet 0   ? ondansetron (ZOFRAN-ODT) 8 MG disintegrating tablet Take 1 tablet (8 mg total) by mouth every 8 (eight) hours as needed for nausea or vomiting. 30 tablet 2   ? Prenatal Vit-Fe Fumarate-FA (PRENATAL MULTIVITAMIN) TABS tablet Take 1 tablet by mouth daily at 12 noon.     ? promethazine (PHENERGAN) 25 MG tablet Take 0.5-1 tablets (12.5-25 mg total) by  mouth every 6 (six) hours as needed for nausea or vomiting. You can place tablets vaginally if you cannot tolerate swallowing the pills. 30 tablet 2   ? scopolamine (TRANSDERM-SCOP) 1 MG/3DAYS Place 1 patch (1.5 mg total) onto the skin every 3 (three) days. 10 patch 12   ? ? ?Review of Systems  ?Constitutional:  Negative for chills and fever.  ?Eyes:  Positive for photophobia. Negative for visual disturbance.  ?Gastrointestinal:  Positive for nausea and vomiting. Negative for abdominal pain.  ?Genitourinary:  Negative for vaginal bleeding.  ?Neurological:  Negative for syncope.  ?Physical Exam  ? ?Blood pressure (!) 101/53, pulse 83, temperature 98.1 ?F (36.7 ?C), temperature source Oral, resp. rate 17, height 5\' 3"  (1.6 m), weight 62.4 kg, last menstrual period 11/23/2020, SpO2 100 %, unknown if currently breastfeeding. ? ?Physical Exam ?Vitals and nursing note reviewed.  ?Constitutional:   ?   General: She is not in acute distress. ?   Appearance: Normal appearance.  ?HENT:  ?   Head: Normocephalic and atraumatic.  ?Eyes:  ?   Extraocular Movements:  Extraocular movements intact.  ?   Pupils: Pupils are equal, round, and reactive to light.  ?Cardiovascular:  ?   Rate and Rhythm: Normal rate.  ?Pulmonary:  ?   Effort: Pulmonary effort is normal. No respiratory distress.  ?Musculoskeletal:     ?   General: Normal range of motion.  ?   Cervical back: Normal range of motion.  ?Skin: ?   General: Skin is warm and dry.  ?Neurological:  ?   General: No focal deficit present.  ?   Mental Status: She is alert and oriented to person, place, and time.  ?   Cranial Nerves: No cranial nerve deficit.  ?   Motor: No weakness.  ?   Gait: Gait normal.  ?   Deep Tendon Reflexes: Reflexes normal.  ?Psychiatric:     ?   Mood and Affect: Mood normal.     ?   Behavior: Behavior normal.  ?EFM: 145 bpm, mod variability, no accels, no decels ?Toco: none ? ?Results for orders placed or performed during the hospital encounter of 05/15/21  (from the past 24 hour(s))  ?Urinalysis, Routine w reflex microscopic Urine, Clean Catch     Status: Abnormal  ? Collection Time: 05/15/21  7:58 AM  ?Result Value Ref Range  ? Color, Urine YELLOW YELLOW  ? APPearance HAZY (A) CLEAR  ? Specific Gravity, Urine 1.026 1.005 - 1.030  ? pH 6.0 5.0 - 8.0  ? Glucose, UA NEGATIVE NEGATIVE mg/dL  ? Hgb urine dipstick NEGATIVE NEGATIVE  ? Bilirubin Urine NEGATIVE NEGATIVE  ? Ketones, ur 80 (A) NEGATIVE mg/dL  ? Protein, ur 30 (A) NEGATIVE mg/dL  ? Nitrite NEGATIVE NEGATIVE  ? Leukocytes,Ua NEGATIVE NEGATIVE  ? RBC / HPF 0-5 0 - 5 RBC/hpf  ? WBC, UA 0-5 0 - 5 WBC/hpf  ? Bacteria, UA NONE SEEN NONE SEEN  ? Squamous Epithelial / LPF 11-20 0 - 5  ? Mucus PRESENT   ? Non Squamous Epithelial 0-5 (A) NONE SEEN  ?CBC     Status: Abnormal  ? Collection Time: 05/15/21  9:08 AM  ?Result Value Ref Range  ? WBC 3.9 (L) 4.0 - 10.5 K/uL  ? RBC 3.02 (L) 3.87 - 5.11 MIL/uL  ? Hemoglobin 9.6 (L) 12.0 - 15.0 g/dL  ? HCT 28.3 (L) 36.0 - 46.0 %  ? MCV 93.7 80.0 - 100.0 fL  ? MCH 31.8 26.0 - 34.0 pg  ? MCHC 33.9 30.0 - 36.0 g/dL  ? RDW 12.8 11.5 - 15.5 %  ? Platelets 134 (L) 150 - 400 K/uL  ? nRBC 0.0 0.0 - 0.2 %  ?Basic metabolic panel     Status: Abnormal  ? Collection Time: 05/15/21  9:08 AM  ?Result Value Ref Range  ? Sodium 137 135 - 145 mmol/L  ? Potassium 3.7 3.5 - 5.1 mmol/L  ? Chloride 107 98 - 111 mmol/L  ? CO2 23 22 - 32 mmol/L  ? Glucose, Bld 80 70 - 99 mg/dL  ? BUN 6 6 - 20 mg/dL  ? Creatinine, Ser 0.57 0.44 - 1.00 mg/dL  ? Calcium 8.8 (L) 8.9 - 10.3 mg/dL  ? GFR, Estimated >60 >60 mL/min  ? Anion gap 7 5 - 15  ? ?MAU Course  ?Procedures ?LR ?Reglan ?Benadryl ?Decadron ? ?MDM ?Labs ordered and reviewed.  Headache improved, rates 2/10.  Tolerating p.o. Anemia noted, discussed supplementation and encouraged FE rich foods.  Mild thrombocytopenia, will follow outpatient.  Stable for discharge. ? ?Assessment  and Plan  ? ?1. [redacted] weeks gestation of pregnancy   ?2. Acute non intractable  tension-type headache   ?3. Anemia during pregnancy in second trimester   ?4. Thrombocytopenia affecting pregnancy (HCC)   ?5. Dehydration   ? ?Discharge home ?[facility] ?Rx Ferrex ?Return precautions ? ?Allergies as of 05/15/2021   ?No Known Allergies ?  ? ?  ?Medication List  ?  ? ?TAKE these medications   ? ?famotidine 20 MG tablet ?Commonly known as: PEPCID ?Take 1 tablet (20 mg total) by mouth 2 (two) times daily. ?  ?iron polysaccharides 150 MG capsule ?Commonly known as: NIFEREX ?Take 1 capsule (150 mg total) by mouth every other day. ?  ?ondansetron 8 MG disintegrating tablet ?Commonly known as: ZOFRAN-ODT ?Take 1 tablet (8 mg total) by mouth every 8 (eight) hours as needed for nausea or vomiting. ?  ?prenatal multivitamin Tabs tablet ?Take 1 tablet by mouth daily at 12 noon. ?  ?promethazine 25 MG tablet ?Commonly known as: PHENERGAN ?Take 0.5-1 tablets (12.5-25 mg total) by mouth every 6 (six) hours as needed for nausea or vomiting. You can place tablets vaginally if you cannot tolerate swallowing the pills. ?  ?scopolamine 1 MG/3DAYS ?Commonly known as: TRANSDERM-SCOP ?Place 1 patch (1.5 mg total) onto the skin every 3 (three) days. ?  ? ?  ? ? ?Donette LarryMelanie Nakari Bracknell, CNM ?05/15/2021, 10:27 AM  ?

## 2021-06-19 DIAGNOSIS — Z3493 Encounter for supervision of normal pregnancy, unspecified, third trimester: Secondary | ICD-10-CM | POA: Diagnosis not present

## 2021-07-24 ENCOUNTER — Encounter (HOSPITAL_COMMUNITY): Payer: Self-pay | Admitting: Obstetrics & Gynecology

## 2021-07-24 ENCOUNTER — Observation Stay (HOSPITAL_COMMUNITY)
Admission: AD | Admit: 2021-07-24 | Discharge: 2021-07-25 | Disposition: A | Discharge status: Home / Self Care | Payer: BC Managed Care – PPO | Attending: Obstetrics & Gynecology | Admitting: Obstetrics & Gynecology

## 2021-07-24 ENCOUNTER — Observation Stay (HOSPITAL_BASED_OUTPATIENT_CLINIC_OR_DEPARTMENT_OTHER): Payer: BC Managed Care – PPO

## 2021-07-24 ENCOUNTER — Other Ambulatory Visit: Payer: Self-pay

## 2021-07-24 ENCOUNTER — Inpatient Hospital Stay (HOSPITAL_BASED_OUTPATIENT_CLINIC_OR_DEPARTMENT_OTHER): Payer: BC Managed Care – PPO

## 2021-07-24 DIAGNOSIS — O289 Unspecified abnormal findings on antenatal screening of mother: Secondary | ICD-10-CM | POA: Diagnosis present

## 2021-07-24 DIAGNOSIS — O288 Other abnormal findings on antenatal screening of mother: Secondary | ICD-10-CM | POA: Insufficient documentation

## 2021-07-24 DIAGNOSIS — O368130 Decreased fetal movements, third trimester, not applicable or unspecified: Secondary | ICD-10-CM | POA: Diagnosis not present

## 2021-07-24 DIAGNOSIS — Z3A32 32 weeks gestation of pregnancy: Secondary | ICD-10-CM

## 2021-07-24 DIAGNOSIS — Z79899 Other long term (current) drug therapy: Secondary | ICD-10-CM | POA: Diagnosis not present

## 2021-07-24 DIAGNOSIS — O26893 Other specified pregnancy related conditions, third trimester: Secondary | ICD-10-CM | POA: Insufficient documentation

## 2021-07-24 DIAGNOSIS — R102 Pelvic and perineal pain: Secondary | ICD-10-CM | POA: Diagnosis not present

## 2021-07-24 LAB — URINALYSIS, ROUTINE W REFLEX MICROSCOPIC
Bilirubin Urine: NEGATIVE
Glucose, UA: NEGATIVE mg/dL
Hgb urine dipstick: NEGATIVE
Ketones, ur: NEGATIVE mg/dL
Leukocytes,Ua: NEGATIVE
Nitrite: NEGATIVE
Protein, ur: NEGATIVE mg/dL
Specific Gravity, Urine: 1.016 (ref 1.005–1.030)
pH: 8 (ref 5.0–8.0)

## 2021-07-24 LAB — GROUP B STREP BY PCR: Group B strep by PCR: NEGATIVE

## 2021-07-24 LAB — COMPREHENSIVE METABOLIC PANEL
ALT: 11 U/L (ref 0–44)
AST: 14 U/L — ABNORMAL LOW (ref 15–41)
Albumin: 3.6 g/dL (ref 3.5–5.0)
Alkaline Phosphatase: 110 U/L (ref 38–126)
Anion gap: 9 (ref 5–15)
BUN: 6 mg/dL (ref 6–20)
CO2: 22 mmol/L (ref 22–32)
Calcium: 9 mg/dL (ref 8.9–10.3)
Chloride: 105 mmol/L (ref 98–111)
Creatinine, Ser: 0.56 mg/dL (ref 0.44–1.00)
GFR, Estimated: >60 mL/min (ref >60)
Glucose, Bld: 77 mg/dL (ref 70–99)
Potassium: 3.8 mmol/L (ref 3.5–5.1)
Sodium: 136 mmol/L (ref 135–145)
Total Bilirubin: 1.1 mg/dL (ref 0.3–1.2)
Total Protein: 7.5 g/dL (ref 6.5–8.1)

## 2021-07-24 LAB — TYPE AND SCREEN
ABO/RH(D): B POS
Antibody Screen: NEGATIVE

## 2021-07-24 LAB — FIBRINOGEN: Fibrinogen: 366 mg/dL (ref 210–475)

## 2021-07-24 LAB — APTT: aPTT: 27 seconds (ref 24–36)

## 2021-07-24 LAB — CBC
HCT: 29.7 % — ABNORMAL LOW (ref 36.0–46.0)
Hemoglobin: 9.5 g/dL — ABNORMAL LOW (ref 12.0–15.0)
MCH: 28.7 pg (ref 26.0–34.0)
MCHC: 32 g/dL (ref 30.0–36.0)
MCV: 89.7 fL (ref 80.0–100.0)
Platelets: 179 10*3/uL (ref 150–400)
RBC: 3.31 MIL/uL — ABNORMAL LOW (ref 3.87–5.11)
RDW: 12.6 % (ref 11.5–15.5)
WBC: 6.6 10*3/uL (ref 4.0–10.5)
nRBC: 0 % (ref 0.0–0.2)

## 2021-07-24 LAB — PROTIME-INR
INR: 1.1 (ref 0.8–1.2)
Prothrombin Time: 13.6 seconds (ref 11.4–15.2)

## 2021-07-24 LAB — GLUCOSE, CAPILLARY
Glucose-Capillary: 210 mg/dL — ABNORMAL HIGH (ref 70–99)
Glucose-Capillary: 153 mg/dL — ABNORMAL HIGH (ref 70–99)

## 2021-07-24 LAB — TSH: TSH: 3.313 u[IU]/mL (ref 0.350–4.500)

## 2021-07-24 MED ORDER — ZOLPIDEM TARTRATE 5 MG PO TABS: 5.0000 mg | ORAL_TABLET | Freq: Every evening | ORAL | Status: DC | PRN

## 2021-07-24 MED ORDER — DOCUSATE SODIUM 100 MG PO CAPS
100.0000 mg | ORAL_CAPSULE | Freq: Every day | ORAL | Status: DC
Start: 2021-07-24 — End: 2021-07-25
  Filled 2021-07-24 (×2): qty 1

## 2021-07-24 MED ORDER — CALCIUM CARBONATE ANTACID 500 MG PO CHEW
2.0000 | CHEWABLE_TABLET | ORAL | Status: DC | PRN
Start: 2021-07-24 — End: 2021-07-25

## 2021-07-24 MED ORDER — LACTATED RINGERS IV SOLN: INTRAVENOUS | Status: DC

## 2021-07-24 MED ORDER — BETAMETHASONE SOD PHOS & ACET 6 (3-3) MG/ML IJ SUSP
12.0000 mg | INTRAMUSCULAR | Status: AC
  Administered 2021-07-24 – 2021-07-25 (×2): 12 mg via INTRAMUSCULAR
  Filled 2021-07-24: qty 5

## 2021-07-24 MED ORDER — ACETAMINOPHEN 325 MG PO TABS: 650.0000 mg | ORAL_TABLET | ORAL | Status: DC | PRN

## 2021-07-24 MED ORDER — PRENATAL MULTIVITAMIN CH
1.0000 | ORAL_TABLET | Freq: Every day | ORAL | Status: DC
  Administered 2021-07-24 – 2021-07-25 (×2): 1 via ORAL
  Filled 2021-07-24 (×3): qty 1

## 2021-07-24 NOTE — Consult Note (Addendum)
MFM Brief Note  I recieved a call from Dr. Barrie Lyme to regarding the BPP for Ms. Sharon Fuller of 4/8. The NST was initially non-reactive but improved.  I recommended continued monitoring with repeat testing in 2-4 weeks.  If the testing is < 4/10 with poor tracing consider delivery.  If 6/10 continuous monitoring with repeat BPP in 12-24 hours.  I discussed the plan of care with Dr. Gaylyn Lambert, MD  I spent 20 minutes with > 50% in non-face to face consultation with Dr. Mora Appl.

## 2021-07-24 NOTE — H&P (Signed)
ANTEPARTUM HISTORY AND PHYSICAL  Admission Date: 07/24/2021 10:58 AM  Admit Diagnosis: Abnormal findings on antenatal screening of mother [O28.9]   Sharon Fuller is a 21 y.o. G2P1001 [redacted]w[redacted]d presented to the MAU this morning with complaint of decreased fetal movements since yesterday. States she Felt movement in the morning after ice cream, but less thorough out the day yesterday. States she did Not feel any last night despite eating a large meal and manually pressing in belly. She endorses pelvic pain, with standing, otherwise no complaints.  She denies contractions, vaginal bleeding, and Leaking of Fluid.   History of current pregnancy: Prenatal Care with: [facility] Patient entered prenatal care at 10 wks.   EDC 09/12/21 by 10 week [place] NOT congruent with EDC of 8/8 by LMP of 11/23/20  Anatomy scan:  20 wks, complete w/ anterior placenta.    Significant prenatal problems: Anemia of pregnancy (28 wk labs Hb 9.5) History of gestational thrombocytopenia (last plt May '23 157) Abnormal head circumference (4th % on last office [place]) Vitamin D deficiency  Patient Active Problem List   Diagnosis Date Noted   Abnormal findings on antenatal screening of mother 07/24/2021    Prenatal Labs: ABO, Rh:  B pos Antibody:  Neg Rubella:   IMM RPR:   NR HBsAg:  NEG HIV:   NEG GTT: 1 HR 134 GBS:   UNK GC/CHL: NEG Genetics: Low risk female NIPS neg [place] Vaccines: Tdap: Not done Flu declined     Medical / Surgical History: Past Obstetric history:  OB History  Gravida Para Term Preterm AB Living  2 1 1  0 0 1  SAB IAB Ectopic Multiple Live Births  0 0 0 0 1    # Outcome Date GA Lbr Len/2nd Weight Sex Delivery Anes PTL Lv  2 Current           1 Term 06/16/17 [redacted]w[redacted]d 15:19 / 00:10 3525 g M Vag-Spont None  LIV   Past medical history:  Past Medical History:  Diagnosis Date   Allergy    History of chicken pox    Medical history non-contributory     Past surgical history:  Past Surgical History:   Procedure Laterality Date   NO PAST SURGERIES     WISDOM TOOTH EXTRACTION     Family History:  Family History  Problem Relation Age of Onset   Kidney failure Mother    Breast cancer Maternal Grandmother    Kidney failure Maternal Grandfather    Asthma Father     Social History:  reports that she has never smoked. She has never used smokeless tobacco. She reports that she does not currently use alcohol. She reports that she does not use drugs.  Allergies: Patient has no known allergies.   Current Medications at time of admission:  Prior to Admission medications   Medication Sig Start Date End Date Taking? Authorizing Provider  iron polysaccharides (NIFEREX) 150 MG capsule Take 1 capsule (150 mg total) by mouth every other day. 05/15/21  Yes 05/17/21, CNM  Prenatal Vit-Fe Fumarate-FA (PRENATAL MULTIVITAMIN) TABS tablet Take 1 tablet by mouth daily at 12 noon.   Yes [provider]  famotidine (PEPCID) 20 MG tablet Take 1 tablet (20 mg total) by mouth 2 (two) times daily. 01/24/21   01/26/21, CNM  ondansetron (ZOFRAN-ODT) 8 MG disintegrating tablet Take 1 tablet (8 mg total) by mouth every 8 (eight) hours as needed for nausea or vomiting. 02/26/21   Leftwich-Kirby, 04/26/21, CNM  promethazine (PHENERGAN) 25 MG tablet Take 0.5-1 tablets (12.5-25 mg total) by mouth every 6 (six) hours as needed for nausea or vomiting. You can place tablets vaginally if you cannot tolerate swallowing the pills. 02/26/21   Leftwich-Kirby, Wilmer Floor, CNM  scopolamine (TRANSDERM-SCOP) 1 MG/3DAYS Place 1 patch (1.5 mg total) onto the skin every 3 (three) days. 03/02/21   Aviva Signs, CNM    Review of Systems: Constitutional: Negative   HENT: Negative   Eyes: Negative   Respiratory: Negative   Cardiovascular: Negative   Gastrointestinal: Negative  Genitourinary: neg for bloody show, neg for LOF   Musculoskeletal: Negative   Skin: Negative   Neurological: Negative    Endo/Heme/Allergies: Negative   Psychiatric/Behavioral: Negative    Physical Exam: VS: Blood pressure (!) 103/53, pulse (!) 104, temperature 98.2 F (36.8 C), temperature source Oral, resp. rate 16, height 5\' 3"  (1.6 m), weight 66.2 kg, last menstrual period 11/23/2020, SpO2 99 %, unknown if currently breastfeeding. AAO x3, no signs of distress GI: Abdomen gravid, non-tender, non-distended GU: Cervical exam deferred Extremities: no edema, negative for pain, tenderness, and cords Neuro: 2+ DTRs bilaterally, no clonus   Last ultrasound in office dated 07/01/21:  SIUP vertex presentation. Anterior placenta. Cervix appears closed transabdominally Measures 4.3 cm AFI: 14.8. EFW: 3# 11 oz 85%   Verbal result BPP today:  BPP 4/8 No breathing or tone, subjectively low (normal) AFI  Fetal Non-Stress Test: FHR: baseline rate initially 125-130 increased to 145 initially with minimal variability then progressed to moderate variability 10x10 accels no visible decelerations TOCO: No contractions      Assessment: 21 y.o. G2P1001 [redacted]w[redacted]d with decreased fetal movement and abnormal antepartum fetal testing (BPP 4/8).   Discussed case with Dr. 6/8, MFM.  Plan:  Admit to Antepartum service Routine antepartum admission orders Will repeat Biophysical Profile in four hours. If BPP remains non-reassuring - will proceed with delivery once steroid complete Continuous fetal monitoring with continuous toco IV hydration CBC, CMP, fasting and pp cbg x 1 If there is a plan for delivery will consult NICU Betamethasone x 24 hours - (first dose today)for fetal lung maturity   Grace Bushy MD 07/24/2021  1:54 PM

## 2021-07-24 NOTE — Progress Notes (Signed)
Spoke with Dr. Grace Bushy.  Current BPP 6/10 (-2 for movement and tone).  I have reviewed the tracing and it is cat 1/reactive.  HE recommeneds continuous monitoring and complete course of BMZ with repeat BPP in the morning.  If BPP 6/10 persists deliver 24hrs after 2nd dose of BMZ.  If NST becomes non-reactive, then deliver.  He will touch base with the other MFMs as well for POC.

## 2021-07-24 NOTE — MAU Note (Signed)
Sharon Fuller is a 21 y.o. at [redacted]w[redacted]d here in MAU reporting: DFM since yesterday, states she felt some in the AM and in the PM but not really during the day. Having some pelvic pressure. Denies bleeding or LOF.   Onset of complaint: yesterday  Pain score: 6/10  Vitals:   07/24/21 1118  BP: (!) 103/53  Pulse: (!) 104  Resp: 16  Temp: 98.2 F (36.8 C)  SpO2: 99%     FHT:145, FM palpated by RN and by patient  Lab orders placed from triage: UA

## 2021-07-24 NOTE — MAU Provider Note (Signed)
History     CSN: [id number]  Arrival date and time: 07/24/21 1058   Event Date/Time   First Provider Initiated Contact with Patient 07/24/21 1124      Chief Complaint  Patient presents with   Decreased Fetal Movement   Pelvic Pain   Sharon Fuller, a  21 y.o. G2P1001 at [redacted]w[redacted]d presents to MAU with complaints of decreased fetal movement since yesterday. States she Felt movement in the morning after ice cream, but less thorough out the day yesterday. States she did Not feel any last night despite eating a large meal and manually pressing in belly. She endorses pelvic pain, with standing, otherwise no complaints.  She denies contractions, vaginal bleeding, and Leaking of Fluid.        Pelvic Pain The patient's primary symptoms include pelvic pain. The patient's pertinent negatives include no vaginal discharge. Pertinent negatives include no abdominal pain, constipation, diarrhea, nausea or vomiting.    OB History     Gravida  2   Para  1   Term  1   Preterm  0   AB  0   Living  1      SAB  0   IAB  0   Ectopic  0   Multiple  0   Live Births  1           Past Medical History:  Diagnosis Date   Allergy    History of chicken pox    Medical history non-contributory     Past Surgical History:  Procedure Laterality Date   NO PAST SURGERIES     WISDOM TOOTH EXTRACTION      Family History  Problem Relation Age of Onset   Kidney failure Mother    Breast cancer Maternal Grandmother    Kidney failure Maternal Grandfather    Asthma Father     Social History   Tobacco Use   Smoking status: Never   Smokeless tobacco: Never  Substance Use Topics   Alcohol use: Not Currently    Comment: not during pregnancy   Drug use: No    Allergies: No Known Allergies  Medications Prior to Admission  Medication Sig Dispense Refill Last Dose   iron polysaccharides (NIFEREX) 150 MG capsule Take 1 capsule (150 mg total) by mouth every other day. 30 capsule 3  07/24/2021   Prenatal Vit-Fe Fumarate-FA (PRENATAL MULTIVITAMIN) TABS tablet Take 1 tablet by mouth daily at 12 noon.   07/23/2021   famotidine (PEPCID) 20 MG tablet Take 1 tablet (20 mg total) by mouth 2 (two) times daily. 30 tablet 0    ondansetron (ZOFRAN-ODT) 8 MG disintegrating tablet Take 1 tablet (8 mg total) by mouth every 8 (eight) hours as needed for nausea or vomiting. 30 tablet 2    promethazine (PHENERGAN) 25 MG tablet Take 0.5-1 tablets (12.5-25 mg total) by mouth every 6 (six) hours as needed for nausea or vomiting. You can place tablets vaginally if you cannot tolerate swallowing the pills. 30 tablet 2    scopolamine (TRANSDERM-SCOP) 1 MG/3DAYS Place 1 patch (1.5 mg total) onto the skin every 3 (three) days. 10 patch 12     Review of Systems  Gastrointestinal:  Negative for abdominal pain, constipation, diarrhea, nausea and vomiting.  Genitourinary:  Positive for pelvic pain. Negative for vaginal bleeding, vaginal discharge and vaginal pain.   Physical Exam   Blood pressure (!) 103/53, pulse (!) 104, temperature 98.2 F (36.8 C), temperature source Oral, resp. rate 16, height 5'  3" (1.6 m), weight 66.2 kg, last menstrual period 11/23/2020, SpO2 99 %, unknown if currently breastfeeding.  Physical Exam Vitals and nursing note reviewed.  Constitutional:      General: She is not in acute distress.    Appearance: She is normal weight.  HENT:     Head: Normocephalic.  Pulmonary:     Effort: Pulmonary effort is normal. No respiratory distress.  Musculoskeletal:     Cervical back: Normal range of motion.  Skin:    General: Skin is warm and dry.  Neurological:     Mental Status: She is alert and oriented to person, place, and time.  Psychiatric:        Mood and Affect: Mood normal.     MAU Course  Procedures Orders Placed This Encounter  Procedures   [place] MFM FETAL BPP WO NON STRESS    Standing Status:   Standing    Number of Occurrences:   1    Order Specific Question:    Symptom/Reason for Exam    Answer:   Decreased fetal movement [[id number]]   Urinalysis, Routine w reflex microscopic Urine, Clean Catch    Standing Status:   Standing    Number of Occurrences:   1    MDM - Preliminary Report Results: Cephalic presentation with anterior placenta.  Movement:Not Observed  Tone: Not Observed  Breathing : Observed  AFV: Pocket => 2cm *Subjectively decreased  Total: 4/8 w/o NST 6/10 with Reactive NST.  - Consulted Dr. Shawnie Pons for admission and repeat BPP in 6 hours. MD agreed.   - Call made to Dr. Mora Appl of [facility] to discuss results and plan of care for patient. NST reviewed. Plan to admit for observation.    Assessment and Plan   Transfer of Care over to Dr. Priscille Heidelberg @ 1340.   Claudette Head, CNM  07/24/2021, 11:24 AM

## 2021-07-25 ENCOUNTER — Observation Stay (HOSPITAL_BASED_OUTPATIENT_CLINIC_OR_DEPARTMENT_OTHER): Payer: BC Managed Care – PPO

## 2021-07-25 DIAGNOSIS — Z3A32 32 weeks gestation of pregnancy: Secondary | ICD-10-CM | POA: Diagnosis not present

## 2021-07-25 DIAGNOSIS — D649 Anemia, unspecified: Secondary | ICD-10-CM | POA: Diagnosis not present

## 2021-07-25 DIAGNOSIS — O368130 Decreased fetal movements, third trimester, not applicable or unspecified: Secondary | ICD-10-CM

## 2021-07-25 DIAGNOSIS — O288 Other abnormal findings on antenatal screening of mother: Secondary | ICD-10-CM | POA: Diagnosis not present

## 2021-07-25 DIAGNOSIS — Z3A33 33 weeks gestation of pregnancy: Secondary | ICD-10-CM | POA: Diagnosis not present

## 2021-07-25 LAB — GLUCOSE, CAPILLARY
Glucose-Capillary: 102 mg/dL — ABNORMAL HIGH (ref 70–99)
Glucose-Capillary: 123 mg/dL — ABNORMAL HIGH (ref 70–99)

## 2021-07-25 NOTE — Progress Notes (Signed)
Patient discharged to home with family.  Condition stable.  Patient ambulated to car with family.  No equipment for home ordered at discharge.

## 2021-07-25 NOTE — Progress Notes (Signed)
Per [place] technician, BPP this morning was 8/8.  Morning EFM showed Category 1 tracing.  Phoned above information to Dr. Sallye Ober.  See order to discontinue continuous fetal monitoring and for NST every shift.  MD to speak to MFM, determine patient disposition, and come talk to patient at bedside.

## 2021-07-25 NOTE — Progress Notes (Signed)
Subjective:     Resting   Objective:     Blood pressure (!) 91/43, pulse 82, temperature 98.3 F (36.8 C), temperature source Oral, resp. rate 17, height 5\' 3"  (1.6 m), weight 66.2 kg, last menstrual period 11/23/2020, SpO2 97 %, unknown if currently breastfeeding.  FHR : baseline 120 / variability moderate / accelerations present / absent decelerations Toco: irritability per TOCO Membranes: intact   Assessment/Plan:    20 y.o. G2P1001 [redacted]w[redacted]d Decreased fetal movement BPP 6/10 Cat I  GBS neg Repeat BPP in AM Fasting CBG BMZ #1 6/29 @ 1433, #2 due 6/30 @ 1430 See MFM note for delivery criteria  7/30, DNP, CNM 07/25/2021 4:37 AM

## 2021-07-25 NOTE — Discharge Summary (Signed)
Physician Discharge Summary  Patient ID: Sharon Fuller MRN: [id number] DOB/AGE: [birth year] 21 y.o.  Admit date: 07/24/2021 Discharge date: 07/25/2021  Admission Diagnoses: Decreased fetal movement. Abnormal findings on antenatal screening of mother (Bpp 4/10) 32 week 6 day EGA pregnancy Anemia  Discharge Diagnoses:   Same as above.   Discharged Condition: good  Hospital Course: 21 y/o G2P1001 admitted at 32 weeks 6 days complaining of decreased fetal movement. An initial BPP showed 4/10 and a repeat one was 6/10. On day of discharge BPP was 10/10, she had a category 1 tracing. She reported resumed normal fetal movement. She was deemed stable for discharge.  She received Betamethasone for fetal lung maturity.   Consults:  Maternal Fetal Medicine.   Significant Diagnostic Studies: BPP ultrasounds.   Treatments: IV hydration and steroids: Betamethasone for fetal lung maturity administered 6/29 and 6/30.   Discharge Exam: Blood pressure (!) 101/58, pulse 82, temperature 98.5 F (36.9 C), temperature source Axillary, resp. rate 18, height 5\' 3"  (1.6 m), weight 66.2 kg, last menstrual period 11/23/2020, SpO2 100 %, unknown if currently breastfeeding. General appearance: alert, cooperative, and no distress Resp: clear to auscultation bilaterally Cardio: regular rate and rhythm, S1, S2 normal, no murmur, click, rub or gallop GI: soft, non-tender; bowel sounds normal; no masses,  no organomegaly Extremities: extremities normal, atraumatic, no cyanosis or edema  Disposition: To Home There are no questions and answers to display.       Allergies as of 07/25/2021   No Known Allergies      Medication List     TAKE these medications    famotidine 20 MG tablet Commonly known as: PEPCID Take 1 tablet (20 mg total) by mouth 2 (two) times daily.   iron polysaccharides 150 MG capsule Commonly known as: NIFEREX Take 1 capsule (150 mg total) by mouth every other day.    ondansetron 8 MG disintegrating tablet Commonly known as: ZOFRAN-ODT Take 1 tablet (8 mg total) by mouth every 8 (eight) hours as needed for nausea or vomiting.   prenatal multivitamin Tabs tablet Take 1 tablet by mouth daily at 12 noon.   promethazine 25 MG tablet Commonly known as: PHENERGAN Take 0.5-1 tablets (12.5-25 mg total) by mouth every 6 (six) hours as needed for nausea or vomiting. You can place tablets vaginally if you cannot tolerate swallowing the pills.   scopolamine 1 MG/3DAYS Commonly known as: TRANSDERM-SCOP Place 1 patch (1.5 mg total) onto the skin every 3 (three) days.        Follow-up Information     [facility]. Go in 1 week(s).   Specialty: Obstetrics and Gynecology Why: Keep your upcoming scheduled appointment. Contact information: [address] [place] [phone]                Signed: [phone], MD.  07/25/2021, 3:16 PM

## 2021-07-25 NOTE — Progress Notes (Signed)
Patient received 2nd dose of betamethasone at 2:34pm this afternoon.  20 minute EFM just now shows Category 1 strip.  Phoned Dr. Sallye Ober with above information.  She said she will place discharge orders.

## 2021-07-25 NOTE — Progress Notes (Signed)
Subjective:    Resting  Objective:    VS: BP (!) 97/48 (BP Location: Right Arm)   Pulse 92   Temp 98.3 F (36.8 C) (Oral)   Resp 17   Ht 5\' 3"  (1.6 m)   Wt 66.2 kg   LMP 11/23/2020   SpO2 98%   BMI 25.85 kg/m  FHR : baseline 120 / variability moderate / accelerations present / occasional variable decelerations Toco: irritability per TOCO Membranes: intact    Assessment/Plan:   20 y.o. G2P1001 [redacted]w[redacted]d Decreased fetal movement BPP 6/10 Cat I with episodes of Cat II that resolves without intervention  Repeat BPP in AM Fasting CBG BMZ #1 6/29 @ 1433, #2 due 6/30 @ 1430 See MFM note for delivery criteria  7/30 DNP, CNM 07/25/2021 1:02 AM

## 2021-08-06 ENCOUNTER — Encounter: Payer: Self-pay | Admitting: Oncology

## 2021-08-06 ENCOUNTER — Inpatient Hospital Stay: Payer: BC Managed Care – PPO | Attending: Oncology | Admitting: Oncology

## 2021-08-06 ENCOUNTER — Inpatient Hospital Stay: Payer: BC Managed Care – PPO

## 2021-08-06 VITALS — BP 96/61 | Ht 63.0 in | Wt 148.0 lb

## 2021-08-06 DIAGNOSIS — D649 Anemia, unspecified: Secondary | ICD-10-CM | POA: Diagnosis not present

## 2021-08-06 DIAGNOSIS — O99013 Anemia complicating pregnancy, third trimester: Secondary | ICD-10-CM | POA: Diagnosis not present

## 2021-08-06 DIAGNOSIS — Z803 Family history of malignant neoplasm of breast: Secondary | ICD-10-CM | POA: Diagnosis not present

## 2021-08-06 DIAGNOSIS — E538 Deficiency of other specified B group vitamins: Secondary | ICD-10-CM | POA: Insufficient documentation

## 2021-08-06 DIAGNOSIS — Z3A35 35 weeks gestation of pregnancy: Secondary | ICD-10-CM | POA: Insufficient documentation

## 2021-08-06 LAB — CBC WITH DIFFERENTIAL/PLATELET
Abs Immature Granulocytes: 0.22 10*3/uL — ABNORMAL HIGH (ref 0.00–0.07)
Basophils Absolute: 0 10*3/uL (ref 0.0–0.1)
Basophils Relative: 0 %
Eosinophils Absolute: 0 10*3/uL (ref 0.0–0.5)
Eosinophils Relative: 0 %
HCT: 28.5 % — ABNORMAL LOW (ref 36.0–46.0)
Hemoglobin: 9 g/dL — ABNORMAL LOW (ref 12.0–15.0)
Immature Granulocytes: 3 %
Lymphocytes Relative: 19 %
Lymphs Abs: 1.3 10*3/uL (ref 0.7–4.0)
MCH: 28 pg (ref 26.0–34.0)
MCHC: 31.6 g/dL (ref 30.0–36.0)
MCV: 88.8 fL (ref 80.0–100.0)
Monocytes Absolute: 0.5 10*3/uL (ref 0.1–1.0)
Monocytes Relative: 6 %
Neutro Abs: 5 10*3/uL (ref 1.7–7.7)
Neutrophils Relative %: 72 %
Platelets: 157 10*3/uL (ref 150–400)
RBC: 3.21 MIL/uL — ABNORMAL LOW (ref 3.87–5.11)
RDW: 13.1 % (ref 11.5–15.5)
WBC: 7 10*3/uL (ref 4.0–10.5)
nRBC: 0 % (ref 0.0–0.2)

## 2021-08-06 LAB — FOLATE: Folate: 31 ng/mL (ref >5.9)

## 2021-08-06 LAB — FERRITIN: Ferritin: 5 ng/mL — ABNORMAL LOW (ref 11–307)

## 2021-08-06 LAB — IRON AND TIBC
Iron: 45 ug/dL (ref 28–170)
Saturation Ratios: 8 % — ABNORMAL LOW (ref 10.4–31.8)
TIBC: 596 ug/dL — ABNORMAL HIGH (ref 250–450)
UIBC: 551 ug/dL

## 2021-08-06 LAB — VITAMIN B12: Vitamin B-12: 134 pg/mL — ABNORMAL LOW (ref 180–914)

## 2021-08-06 NOTE — Progress Notes (Signed)
Hematology/Oncology Consult note [facility] Telephone:([phone] [phone]  Patient Care Team: Patient, No Pcp Per as PCP - General (General Practice)   Name of the patient: Sharon Fuller  [id number]  [birth year]    Reason for referral-anemia   Referring physician- Nigel Bridgeman CNM  Date of visit: 08/06/21   History of presenting illness- Patient is a 21 year old female presently at 59 weeks of gestation referred for anemiaHer most recent hemoglobin and hematocrit from February 15, 2028 was 11.6/34.2 with an MCV of 89 in May 2023 her hemoglobin dropped down to 9.5  ECOG PS- 0  Pain scale- 0   Review of systems- Review of Systems  Constitutional:  Negative for chills, fever, malaise/fatigue and weight loss.  HENT:  Negative for congestion, ear discharge and nosebleeds.   Eyes:  Negative for blurred vision.  Respiratory:  Negative for cough, hemoptysis, sputum production, shortness of breath and wheezing.   Cardiovascular:  Negative for chest pain, palpitations, orthopnea and claudication.  Gastrointestinal:  Negative for abdominal pain, blood in stool, constipation, diarrhea, heartburn, melena, nausea and vomiting.  Genitourinary:  Negative for dysuria, flank pain, frequency, hematuria and urgency.  Musculoskeletal:  Negative for back pain, joint pain and myalgias.  Skin:  Negative for rash.  Neurological:  Negative for dizziness, tingling, focal weakness, seizures, weakness and headaches.  Endo/Heme/Allergies:  Does not bruise/bleed easily.  Psychiatric/Behavioral:  Negative for depression and suicidal ideas. The patient does not have insomnia.     No Known Allergies  Patient Active Problem List   Diagnosis Date Noted   Abnormal findings on antenatal screening of mother 07/24/2021     Past Medical History:  Diagnosis Date   Allergy    Anemia    History of chicken pox    Medical history non-contributory      Past Surgical  History:  Procedure Laterality Date   NO PAST SURGERIES     WISDOM TOOTH EXTRACTION      Social History   Socioeconomic History   Marital status: Single    Spouse name: Not on file   Number of children: Not on file   Years of education: Not on file   Highest education level: Not on file  Occupational History   Not on file  Tobacco Use   Smoking status: Never   Smokeless tobacco: Never  Vaping Use   Vaping Use: Never used  Substance and Sexual Activity   Alcohol use: Not Currently    Comment: not during pregnancy   Drug use: No   Sexual activity: Yes    Birth control/protection: None  Other Topics Concern   Not on file  Social History Narrative   Not on file   Social Determinants of Health   Financial Resource Strain: Not on file  Food Insecurity: Not on file  Transportation Needs: Not on file  Physical Activity: Not on file  Stress: Not on file  Social Connections: Not on file  Intimate Partner Violence: Not on file     Family History  Problem Relation Age of Onset   Kidney failure Mother    Breast cancer Maternal Grandmother    Kidney failure Maternal Grandfather    Asthma Father      Current Outpatient Medications:    iron polysaccharides (NIFEREX) 150 MG capsule, Take 1 capsule (150 mg total) by mouth every other day., Disp: 30 capsule, Rfl: 3   Prenatal Vit-Fe Fumarate-FA (PRENATAL MULTIVITAMIN) TABS tablet, Take 1 tablet by mouth daily at  12 noon., Disp: , Rfl:    Physical exam:  Vitals:   08/06/21 1413  BP: 96/61  Weight: 148 lb (67.1 kg)  Height: 5\' 3"  (1.6 m)   Physical Exam Cardiovascular:     Rate and Rhythm: Normal rate and regular rhythm.     Heart sounds: Normal heart sounds.  Pulmonary:     Effort: Pulmonary effort is normal.     Breath sounds: Normal breath sounds.  Abdominal:     Comments: Gravid uterus  Musculoskeletal:     Right lower leg: No edema.     Left lower leg: No edema.  Skin:    General: Skin is warm and dry.   Neurological:     Mental Status: She is alert and oriented to person, place, and time.           Latest Ref Rng & Units 07/24/2021    2:26 PM  CMP  Glucose 70 - 99 mg/dL 77   BUN 6 - 20 mg/dL 6   Creatinine 0.44 - 1.00 mg/dL 0.56   Sodium 135 - 145 mmol/L 136   Potassium 3.5 - 5.1 mmol/L 3.8   Chloride 98 - 111 mmol/L 105   CO2 22 - 32 mmol/L 22   Calcium 8.9 - 10.3 mg/dL 9.0   Total Protein 6.5 - 8.1 g/dL 7.5   Total Bilirubin 0.3 - 1.2 mg/dL 1.1   Alkaline Phos 38 - 126 U/L 110   AST 15 - 41 U/L 14   ALT 0 - 44 U/L 11       Latest Ref Rng & Units 07/24/2021    2:26 PM  CBC  WBC 4.0 - 10.5 K/uL 6.6   Hemoglobin 12.0 - 15.0 g/dL 9.5   Hematocrit 36.0 - 46.0 % 29.7   Platelets 150 - 400 K/uL 179     No images are attached to the encounter.  [place] MFM FETAL BPP WO NON STRESS  Result Date: 07/25/2021 ----------------------------------------------------------------------  OBSTETRICS REPORT                       (Signed Final 07/25/2021 10:25 am) ---------------------------------------------------------------------- Patient Info  ID #:       [id number]                          D.O.B.:  02/16/2000 (20 yrs)  Name:       Sharon Fuller                   Visit Date: 07/25/2021 08:14 am ---------------------------------------------------------------------- Performed By  Attending:        Sander Nephew      Referred By:      [facility] MAU/Triage                    MD  Performed By:     Germain Osgood            [facility]                    [place] ---------------------------------------------------------------------- Orders  #  Description  Code        Ordered By  1  [place] MFM FETAL BPP WO NON               76819.01    [facility] GRICE     STRESS ----------------------------------------------------------------------  #  Order #                     Accession #                Episode #  1  [id number]                    [id number]                 [id number] ---------------------------------------------------------------------- Indications  [redacted] weeks gestation of pregnancy                Z3A.33  Decreased fetal movement                       O36.8190 ---------------------------------------------------------------------- Fetal Evaluation  Num Of Fetuses:         1  Fetal Heart Rate(bpm):  125  Cardiac Activity:       Observed  Presentation:           Cephalic  Placenta:               Anterior  Amniotic Fluid  AFI FV:      Within normal limits  AFI Sum(cm)     %Tile       Largest Pocket(cm)  11.5            29          3.85  RUQ(cm)       RLQ(cm)       LUQ(cm)        LLQ(cm)  3.61          1.12          2.92           3.85 ---------------------------------------------------------------------- Biophysical Evaluation  Amniotic F.V:   Within normal limits       F. Tone:        Observed  F. Movement:    Observed                   N.S.T:          Reactive  F. Breathing:   Observed                   Score:          10/10 ---------------------------------------------------------------------- Biometry  LV:        3.1  mm ---------------------------------------------------------------------- OB History  Gravidity:    2         Term:   1 ---------------------------------------------------------------------- Gestational Age  LMP:           34w 6d        Date:  11/23/20                 EDD:   08/30/21  Best:          33w 0d     Det. ByLoman Chroman         EDD:   09/12/21                                      (  02/19/21) ---------------------------------------------------------------------- Anatomy  Ventricles:            Appears normal         Kidneys:                Appear normal  Diaphragm:             Appears normal         Bladder:                Appears normal  Stomach:               Appears normal, left                         sided ---------------------------------------------------------------------- Impression  Antenatal testing given  equivocal antenatal testing  Biophysical profile 10/10 with maternal sensation of good  fetal movement.  I discussed with Dr. Sallye Ober who will advised the patient on  daily kick counts.  She will plan on discharge today with weekly testing. ----------------------------------------------------------------------              Lin Landsman, MD Electronically Signed Final Report   07/25/2021 10:25 am ----------------------------------------------------------------------  [place] MFM FETAL BPP WO NON STRESS  Result Date: 07/25/2021 ----------------------------------------------------------------------  OBSTETRICS REPORT                       (Signed Final 07/25/2021 10:20 am) ---------------------------------------------------------------------- Patient Info  ID #:       [id number]                          D.O.B.:  03-30-2000 (20 yrs)  Name:       [facility]                   Visit Date: 07/24/2021 06:58 pm ---------------------------------------------------------------------- Performed By  Attending:        Lin Landsman      Referred By:      [facility] MAU/Triage                    MD  Performed By:     Anabel Halon          Location[facility]                    [facility] ---------------------------------------------------------------------- Orders  #  Description                           Code        Ordered By  1  [place] MFM FETAL BPP WO NON               40981.19    WALDA PINN     STRESS ----------------------------------------------------------------------  #  Order #                     Accession #                Episode #  1  [id number]  [id number]                 [id number] ---------------------------------------------------------------------- Indications  Decreased fetal movement                       O36.8190  [redacted] weeks gestation of pregnancy                Z3A.32 ---------------------------------------------------------------------- Fetal  Evaluation  Num Of Fetuses:         1  Fetal Heart Rate(bpm):  152  Cardiac Activity:       Observed  Amniotic Fluid  AFI FV:      Within normal limits  AFI Sum(cm)     %Tile       Largest Pocket(cm)  14.71           52          4.43  RUQ(cm)       RLQ(cm)       LUQ(cm)        LLQ(cm)  4.43          3.88          4.21           2.19 ---------------------------------------------------------------------- Biophysical Evaluation  Amniotic F.V:   Pocket => 2 cm             F. Tone:        Not Observed  F. Movement:    Not Observed               N.S.T:          Reactive  F. Breathing:   Observed                   Score:          6/10 ---------------------------------------------------------------------- Biometry  LV:        5.9  mm ---------------------------------------------------------------------- OB History  Gravidity:    2         Term:   1 ---------------------------------------------------------------------- Gestational Age  LMP:           34w 5d        Date:  11/23/20                 EDD:   08/30/21  Best:          Milderd Meager 6d     Det. By:  Loman Chroman         EDD:   09/12/21                                      (02/19/21) ---------------------------------------------------------------------- Anatomy  Stomach:               Appears normal, left   Bladder:                Appears normal                         sided ---------------------------------------------------------------------- Cervix Uterus Adnexa  Cervix  Not visualized (advanced GA >24wks)  Uterus  No abnormality visualized.  Right Ovary  Not visualized.  Left Ovary  Not visualized.  Cul De Sac  No free fluid seen.  Adnexa  No abnormality visualized. ---------------------------------------------------------------------- Impression  Antenatal testing due to maternal decreased fetal movement  Given reactive tracing and < 32 weeks will  remain on  continuous monitoring and repeat BPP in 12 hours.  6/10- Reactive tracing per Dr. Mancel Bale.  I discussed the plan fo  care with Dr. Mancel Bale. ----------------------------------------------------------------------              Sander Nephew, MD Electronically Signed Final Report   07/25/2021 10:20 am ----------------------------------------------------------------------  [place] MFM FETAL BPP WO NON STRESS  Result Date: 07/24/2021 ----------------------------------------------------------------------  OBSTETRICS REPORT                       (Signed Final 07/24/2021 04:12 pm) ---------------------------------------------------------------------- Patient Info  ID #:       [id number]                          D.O.B.:  Jan 18, 2001 (20 yrs)  Name:       [facility]                   Visit Date: 07/24/2021 12:22 pm ---------------------------------------------------------------------- Performed By  Attending:        Sander Nephew      Referred By:      [facility] MAU/Triage                    MD  Performed By:     Nevin Bloodgood          Location:         [facility]                    [place] ---------------------------------------------------------------------- Orders  #  Description                           Code        Ordered By  1  [place] MFM FETAL BPP WO NON               [id number]    [place] ----------------------------------------------------------------------  #  Order #                     Accession #                Episode #  1  [id number]                   [id number]                 [id number] ---------------------------------------------------------------------- Indications  Decreased fetal movement                       O36.8190  [redacted] weeks gestation of pregnancy                Z3A.32 ---------------------------------------------------------------------- Fetal Evaluation  Num Of Fetuses:         1  Fetal Heart Rate(bpm):  138  Cardiac Activity:       Observed  Presentation:  Cephalic  Placenta:               Anterior   Amniotic Fluid  AFI FV:      Subjectively decreased  AFI Sum(cm)     %Tile       Largest Pocket(cm)  8.              4.2         2.99  RUQ(cm)       RLQ(cm)       LUQ(cm)        LLQ(cm)  2.99          0             2.5            2.51 ---------------------------------------------------------------------- Biophysical Evaluation  Amniotic F.V:   Pocket => 2 cm             F. Tone:        Not Observed  F. Movement:    Not Observed               Score:          4/8  F. Breathing:   Observed ---------------------------------------------------------------------- Biometry  LV:        5.9  mm ---------------------------------------------------------------------- OB History  Gravidity:    2         Term:   1 ---------------------------------------------------------------------- Gestational Age  LMP:           34w 5d        Date:  11/23/20                 EDD:   08/30/21  Best:          Milderd Meager 6d     Det. ByLoman Chroman         EDD:   09/12/21                                      (02/19/21) ---------------------------------------------------------------------- Cervix Uterus Adnexa  Cervix  Not visualized (advanced GA >24wks)  Uterus  No abnormality visualized.  Right Ovary  Not visualized.  Left Ovary  Not visualized.  Cul De Sac  No free fluid seen.  Adnexa  No abnormality visualized. ---------------------------------------------------------------------- Impression  MFM Brief Note  I recieved a call from Dr. Christella Scheuermann to regarding the BPP for  Ms. Kennon Rounds of 4/8.  The NST was initially non-reactive but improved.  I recommended continued monitoring with repeat testing in 2-  4 weeks.  If the testing is < 4/10 with poor tracing consider delivery.  If 6/10 continuous monitoring with repeat BPP in 12-24 hours.  I discussed the plan of care with Dr. Alwyn Pea ----------------------------------------------------------------------              Sander Nephew, MD Electronically Signed Final Report   07/24/2021 04:12 pm  ----------------------------------------------------------------------   Assessment and plan- Patient is a 21 y.o. female referred for anemia in third trimester of pregnancy  Normocytic anemia possibly secondary to iron deficiency.  I will be doing anemia work-up today including CBC ferritin and iron studies B12 and folate.  If iron levels are low we will proceed with 5 doses of Venofer.  Patient is close to her due date and is currently at 56 weeks of pregnancy.  We will not have the time to check her repeat iron  studies prior to her delivery.  I will plan to see her back in 3 months with CBC ferritin and iron studies.  Discussed risks and benefits of IV iron including all but not limited to possible risk of allergic and anaphylactic reaction.  Patient understands and agrees to proceed as planned   Thank you for this kind referral and the opportunity to participate in the care of this patient   Visit Diagnosis 1. Anemia affecting pregnancy in third trimester     Dr. Randa Evens, MD, MPH [facility] at [facility] [id number] 08/06/2021

## 2021-08-06 NOTE — Progress Notes (Signed)
Pt 35 weeks and 5 days, felling tired and worn out. Taking iron pill every other day. Some cramps but not bad and was told the cramps can come from other things so not sure what causes it.

## 2021-08-07 ENCOUNTER — Telehealth: Payer: Self-pay | Admitting: *Deleted

## 2021-08-07 NOTE — Telephone Encounter (Signed)
Called the pt and let her know that levels were low and she needs iron infusions. When I spoke to her the day [facility] she said that tues and thurs. Is days she can get infusions. Today on the phone she says it is better for tues and Friday. I told her that I will reach out to the scheduler and and get 5 appts scheduled for her . Patient would like her to call the pt with visits and then send it my chart for back up. I have sent the message to [facility] to schedule

## 2021-08-12 ENCOUNTER — Inpatient Hospital Stay: Payer: BC Managed Care – PPO

## 2021-08-12 ENCOUNTER — Other Ambulatory Visit: Payer: Self-pay | Admitting: Oncology

## 2021-08-12 ENCOUNTER — Other Ambulatory Visit: Payer: Self-pay | Admitting: *Deleted

## 2021-08-12 ENCOUNTER — Encounter: Payer: Self-pay | Admitting: Oncology

## 2021-08-12 VITALS — BP 99/61 | HR 87 | Temp 97.1°F | Resp 16

## 2021-08-12 DIAGNOSIS — E538 Deficiency of other specified B group vitamins: Secondary | ICD-10-CM | POA: Insufficient documentation

## 2021-08-12 DIAGNOSIS — O99013 Anemia complicating pregnancy, third trimester: Secondary | ICD-10-CM | POA: Diagnosis not present

## 2021-08-12 MED ORDER — SODIUM CHLORIDE 0.9 % IV SOLN
200.0000 mg | INTRAVENOUS | Status: DC
  Administered 2021-08-12: 200 mg via INTRAVENOUS
  Filled 2021-08-12: qty 200

## 2021-08-12 MED ORDER — SODIUM CHLORIDE 0.9 % IV SOLN
Freq: Once | INTRAVENOUS | Status: AC
  Filled 2021-08-12: qty 250

## 2021-08-12 MED ORDER — CYANOCOBALAMIN 1000 MCG/ML IJ SOLN
1000.0000 ug | Freq: Once | INTRAMUSCULAR | Status: AC
  Administered 2021-08-12: 1000 ug via INTRAMUSCULAR
  Filled 2021-08-12: qty 1

## 2021-08-14 ENCOUNTER — Telehealth: Payer: Self-pay

## 2021-08-14 ENCOUNTER — Inpatient Hospital Stay: Payer: BC Managed Care – PPO

## 2021-08-14 ENCOUNTER — Encounter: Payer: Self-pay | Admitting: Oncology

## 2021-08-14 VITALS — BP 95/58 | HR 99 | Temp 96.8°F

## 2021-08-14 DIAGNOSIS — O99013 Anemia complicating pregnancy, third trimester: Secondary | ICD-10-CM | POA: Diagnosis not present

## 2021-08-14 DIAGNOSIS — E538 Deficiency of other specified B group vitamins: Secondary | ICD-10-CM

## 2021-08-14 MED ORDER — SODIUM CHLORIDE 0.9 % IV SOLN
200.0000 mg | INTRAVENOUS | Status: DC
  Administered 2021-08-14: 200 mg via INTRAVENOUS
  Filled 2021-08-14: qty 200

## 2021-08-14 MED ORDER — SODIUM CHLORIDE 0.9 % IV SOLN
Freq: Once | INTRAVENOUS | Status: AC
  Filled 2021-08-14: qty 250

## 2021-08-14 MED ORDER — CYANOCOBALAMIN 1000 MCG/ML IJ SOLN
1000.0000 ug | Freq: Once | INTRAMUSCULAR | Status: AC
  Administered 2021-08-14: 1000 ug via INTRAMUSCULAR
  Filled 2021-08-14: qty 1

## 2021-08-14 NOTE — Progress Notes (Signed)
Patient tolerated Venofer infusion well, no questions/concerns voiced. Patient states BP runs low, asymptomatic. Patient stable at discharge. Refused AVS .

## 2021-08-14 NOTE — Telephone Encounter (Signed)
Called patient to inform her per Dr. Smith Robert low BP ok and to start taking the oral Iron.No answer, left voicemail.

## 2021-08-15 DIAGNOSIS — O368199 Decreased fetal movements, unspecified trimester, other fetus: Secondary | ICD-10-CM | POA: Diagnosis not present

## 2021-08-15 DIAGNOSIS — Z3A36 36 weeks gestation of pregnancy: Secondary | ICD-10-CM | POA: Diagnosis not present

## 2021-08-19 ENCOUNTER — Inpatient Hospital Stay: Payer: BC Managed Care – PPO

## 2021-08-19 VITALS — BP 94/55 | HR 86 | Temp 98.6°F | Resp 19

## 2021-08-19 DIAGNOSIS — E538 Deficiency of other specified B group vitamins: Secondary | ICD-10-CM

## 2021-08-19 DIAGNOSIS — O99013 Anemia complicating pregnancy, third trimester: Secondary | ICD-10-CM

## 2021-08-19 MED ORDER — SODIUM CHLORIDE 0.9 % IV SOLN
Freq: Once | INTRAVENOUS | Status: AC
  Filled 2021-08-19: qty 250

## 2021-08-19 MED ORDER — CYANOCOBALAMIN 1000 MCG/ML IJ SOLN: 1000.0000 ug | Freq: Once | INTRAMUSCULAR | Status: DC

## 2021-08-19 MED ORDER — SODIUM CHLORIDE 0.9 % IV SOLN
200.0000 mg | INTRAVENOUS | Status: DC
  Administered 2021-08-19: 200 mg via INTRAVENOUS
  Filled 2021-08-19: qty 200

## 2021-08-19 NOTE — Patient Instructions (Signed)

## 2021-08-20 MED FILL — Iron Sucrose Inj 20 MG/ML (Fe Equiv): INTRAVENOUS | Qty: 10 | Status: AC

## 2021-08-21 ENCOUNTER — Inpatient Hospital Stay: Payer: BC Managed Care – PPO

## 2021-08-21 VITALS — BP 94/55 | HR 89 | Temp 97.5°F | Resp 18

## 2021-08-21 DIAGNOSIS — O99013 Anemia complicating pregnancy, third trimester: Secondary | ICD-10-CM

## 2021-08-21 DIAGNOSIS — E538 Deficiency of other specified B group vitamins: Secondary | ICD-10-CM

## 2021-08-21 MED ORDER — SODIUM CHLORIDE 0.9 % IV SOLN
Freq: Once | INTRAVENOUS | Status: AC
  Filled 2021-08-21: qty 250

## 2021-08-21 MED ORDER — CYANOCOBALAMIN 1000 MCG/ML IJ SOLN
1000.0000 ug | Freq: Once | INTRAMUSCULAR | Status: AC
  Administered 2021-08-21: 1000 ug via INTRAMUSCULAR
  Filled 2021-08-21: qty 1

## 2021-08-21 MED ORDER — SODIUM CHLORIDE 0.9 % IV SOLN
200.0000 mg | INTRAVENOUS | Status: DC
  Administered 2021-08-21: 200 mg via INTRAVENOUS
  Filled 2021-08-21: qty 200

## 2021-08-22 DIAGNOSIS — Z3A37 37 weeks gestation of pregnancy: Secondary | ICD-10-CM | POA: Diagnosis not present

## 2021-08-22 DIAGNOSIS — O365999 Maternal care for other known or suspected poor fetal growth, unspecified trimester, other fetus: Secondary | ICD-10-CM | POA: Diagnosis not present

## 2021-08-26 ENCOUNTER — Inpatient Hospital Stay: Payer: BC Managed Care – PPO | Attending: Oncology

## 2021-08-26 VITALS — BP 104/63 | HR 84 | Temp 96.3°F | Resp 18

## 2021-08-26 DIAGNOSIS — D649 Anemia, unspecified: Secondary | ICD-10-CM | POA: Insufficient documentation

## 2021-08-26 DIAGNOSIS — Z79899 Other long term (current) drug therapy: Secondary | ICD-10-CM | POA: Diagnosis not present

## 2021-08-26 DIAGNOSIS — E538 Deficiency of other specified B group vitamins: Secondary | ICD-10-CM

## 2021-08-26 DIAGNOSIS — O99013 Anemia complicating pregnancy, third trimester: Secondary | ICD-10-CM

## 2021-08-26 MED ORDER — CYANOCOBALAMIN 1000 MCG/ML IJ SOLN
1000.0000 ug | Freq: Once | INTRAMUSCULAR | Status: AC
  Administered 2021-08-26: 1000 ug via INTRAMUSCULAR
  Filled 2021-08-26: qty 1

## 2021-08-26 MED ORDER — SODIUM CHLORIDE 0.9 % IV SOLN
Freq: Once | INTRAVENOUS | Status: AC
  Filled 2021-08-26: qty 250

## 2021-08-26 MED ORDER — SODIUM CHLORIDE 0.9 % IV SOLN
200.0000 mg | INTRAVENOUS | Status: DC
  Administered 2021-08-26: 200 mg via INTRAVENOUS
  Filled 2021-08-26: qty 200

## 2021-08-26 NOTE — Patient Instructions (Signed)
[facility]  Discharge Instructions: Thank you for choosing [facility] to provide your oncology and hematology care.  If you have a lab appointment with the Cancer Center, please go directly to the Cancer Center and check in at the registration area.  Wear comfortable clothing and clothing appropriate for easy access to any Portacath or PICC line.   We strive to give you quality time with your provider. You may need to reschedule your appointment if you arrive late (15 or more minutes).  Arriving late affects you and other patients whose appointments are after yours.  Also, if you miss three or more appointments without notifying the office, you may be dismissed from the clinic at the provider's discretion.      For prescription refill requests, have your pharmacy contact our office and allow 72 hours for refills to be completed.    Today you received the following chemotherapy and/or immunotherapy agents VENOFER      To help prevent nausea and vomiting after your treatment, we encourage you to take your nausea medication as directed.  BELOW ARE SYMPTOMS THAT SHOULD BE REPORTED IMMEDIATELY: *FEVER GREATER THAN 100.4 F (38 C) OR HIGHER *CHILLS OR SWEATING *NAUSEA AND VOMITING THAT IS NOT CONTROLLED WITH YOUR NAUSEA MEDICATION *UNUSUAL SHORTNESS OF BREATH *UNUSUAL BRUISING OR BLEEDING *URINARY PROBLEMS (pain or burning when urinating, or frequent urination) *BOWEL PROBLEMS (unusual diarrhea, constipation, pain near the anus) TENDERNESS IN MOUTH AND THROAT WITH OR WITHOUT PRESENCE OF ULCERS (sore throat, sores in mouth, or a toothache) UNUSUAL RASH, SWELLING OR PAIN  UNUSUAL VAGINAL DISCHARGE OR ITCHING   Items with * indicate a potential emergency and should be followed up as soon as possible or go to the Emergency Department if any problems should occur.  Please show the CHEMOTHERAPY ALERT CARD or IMMUNOTHERAPY ALERT CARD at check-in to the  Emergency Department and triage nurse.  Should you have questions after your visit or need to cancel or reschedule your appointment, please contact [facility]  [phone] and follow the prompts.  Office hours are 8:00 a.m. to 4:30 p.m. Monday - Friday. Please note that voicemails left after 4:00 p.m. may not be returned until the following business day.  We are closed weekends and major holidays. You have access to a nurse at all times for urgent questions. Please call the main number to the clinic [phone] and follow the prompts.  For any non-urgent questions, you may also contact your provider using MyChart. We now offer e-Visits for anyone 18 and older to request care online for non-urgent symptoms. For details visit [email or web address].   Also download the MyChart app! Go to the app store, search "MyChart", open the app, select [facility], and log in with your MyChart username and password.  Masks are optional in the cancer centers. If you would like for your care team to wear a mask while they are taking care of you, please let them know. For doctor visits, patients may have with them one support person who is at least 23 years old. At this time, visitors are not allowed in the infusion area.   Iron Sucrose Injection What is this medication? IRON SUCROSE (EYE ern SOO krose) treats low levels of iron (iron deficiency anemia) in people with kidney disease. Iron is a mineral that plays an important role in making red blood cells, which carry oxygen from your lungs to the rest of your body. This medicine may   be used for other purposes; ask your health care provider or pharmacist if you have questions. COMMON BRAND NAME(S): Venofer What should I tell my care team before I take this medication? They need to know if you have any of these conditions: Anemia not caused by low iron levels Heart disease High levels of iron in the blood Kidney disease Liver  disease An unusual or allergic reaction to iron, other medications, foods, dyes, or preservatives Pregnant or trying to get pregnant Breast-feeding How should I use this medication? This medication is for infusion into a vein. It is given in a hospital or clinic setting. Talk to your care team about the use of this medication in children. While this medication may be prescribed for children as young as 2 years for selected conditions, precautions do apply. Overdosage: If you think you have taken too much of this medicine contact a poison control center or emergency room at once. NOTE: This medicine is only for you. Do not share this medicine with others. What if I miss a dose? It is important not to miss your dose. Call your care team if you are unable to keep an appointment. What may interact with this medication? Do not take this medication with any of the following: Deferoxamine Dimercaprol Other iron products This medication may also interact with the following: Chloramphenicol Deferasirox This list may not describe all possible interactions. Give your health care provider a list of all the medicines, herbs, non-prescription drugs, or dietary supplements you use. Also tell them if you smoke, drink alcohol, or use illegal drugs. Some items may interact with your medicine. What should I watch for while using this medication? Visit your care team regularly. Tell your care team if your symptoms do not start to get better or if they get worse. You may need blood work done while you are taking this medication. You may need to follow a special diet. Talk to your care team. Foods that contain iron include: whole grains/cereals, dried fruits, beans, or peas, leafy green vegetables, and organ meats (liver, kidney). What side effects may I notice from receiving this medication? Side effects that you should report to your care team as soon as possible: Allergic reactions--skin rash, itching, hives,  swelling of the face, lips, tongue, or throat Low blood pressure--dizziness, feeling faint or lightheaded, blurry vision Shortness of breath Side effects that usually do not require medical attention (report to your care team if they continue or are bothersome): Flushing Headache Joint pain Muscle pain Nausea Pain, redness, or irritation at injection site This list may not describe all possible side effects. Call your doctor for medical advice about side effects. You may report side effects to FDA at 1-800-FDA-1088. Where should I keep my medication? This medication is given in a hospital or clinic and will not be stored at home. NOTE: This sheet is a summary. It may not cover all possible information. If you have questions about this medicine, talk to your doctor, pharmacist, or health care provider.  2023 Elsevier/Gold Standard (2020-06-07 00:00:00)   

## 2021-08-27 ENCOUNTER — Inpatient Hospital Stay (HOSPITAL_COMMUNITY)
Admission: AD | Admit: 2021-08-27 | Discharge: 2021-08-27 | Disposition: A | Discharge status: Home / Self Care | Payer: BC Managed Care – PPO | Attending: Obstetrics & Gynecology | Admitting: Obstetrics & Gynecology

## 2021-08-27 ENCOUNTER — Encounter (HOSPITAL_COMMUNITY): Payer: Self-pay | Admitting: Obstetrics & Gynecology

## 2021-08-27 DIAGNOSIS — Z3689 Encounter for other specified antenatal screening: Secondary | ICD-10-CM | POA: Insufficient documentation

## 2021-08-27 DIAGNOSIS — O368130 Decreased fetal movements, third trimester, not applicable or unspecified: Secondary | ICD-10-CM | POA: Insufficient documentation

## 2021-08-27 DIAGNOSIS — O368190 Decreased fetal movements, unspecified trimester, not applicable or unspecified: Secondary | ICD-10-CM | POA: Diagnosis not present

## 2021-08-27 DIAGNOSIS — Z3A37 37 weeks gestation of pregnancy: Secondary | ICD-10-CM | POA: Diagnosis not present

## 2021-08-27 DIAGNOSIS — O365930 Maternal care for other known or suspected poor fetal growth, third trimester, not applicable or unspecified: Secondary | ICD-10-CM | POA: Diagnosis not present

## 2021-08-27 NOTE — MAU Provider Note (Signed)
History     CSN: [id number]  Arrival date and time: 08/27/21 1696   Event Date/Time   First Provider Initiated Contact with Patient 08/27/21 1923      Chief Complaint  Patient presents with   Decreased Fetal Movement   HPI  Ms.Sharon Fuller is a 21 y.o. female G2P1001 @ [redacted]w[redacted]d here in MAU with complaints of decreased fetal movement. She was seen today at her OB office and was told that the baby was doing everything it is supposed to do. She wanted more reassurance. She felt the baby move 1x while in the triage room waiting. Overall the fetal movements are less. No bleeding or leaking of fluid.   OB History     Gravida  2   Para  1   Term  1   Preterm  0   AB  0   Living  1      SAB  0   IAB  0   Ectopic  0   Multiple  0   Live Births  1           Past Medical History:  Diagnosis Date   Allergy    Anemia    History of chicken pox    Medical history non-contributory     Past Surgical History:  Procedure Laterality Date   NO PAST SURGERIES     WISDOM TOOTH EXTRACTION      Family History  Problem Relation Age of Onset   Kidney failure Mother    Breast cancer Maternal Grandmother    Kidney failure Maternal Grandfather    Asthma Father     Social History   Tobacco Use   Smoking status: Never   Smokeless tobacco: Never  Vaping Use   Vaping Use: Never used  Substance Use Topics   Alcohol use: Not Currently    Comment: not during pregnancy   Drug use: No    Allergies: No Known Allergies  Medications Prior to Admission  Medication Sig Dispense Refill Last Dose   iron polysaccharides (NIFEREX) 150 MG capsule Take 1 capsule (150 mg total) by mouth every other day. 30 capsule 3    Prenatal Vit-Fe Fumarate-FA (PRENATAL MULTIVITAMIN) TABS tablet Take 1 tablet by mouth daily at 12 noon.      No results found for this or any previous visit (from the past 72 hour(s)).  Review of Systems  Gastrointestinal:  Negative for abdominal pain.   Genitourinary:  Negative for vaginal bleeding and vaginal discharge.   Physical Exam   Blood pressure 104/60, pulse 83, temperature 98.3 F (36.8 C), resp. rate 18, height 5\' 3"  (1.6 m), weight 68.9 kg, last menstrual period 11/23/2020, SpO2 99 %, unknown if currently breastfeeding.  Physical Exam Constitutional:      General: She is not in acute distress.    Appearance: Normal appearance. She is not ill-appearing, toxic-appearing or diaphoretic.  Musculoskeletal:        General: Normal range of motion.  Skin:    General: Skin is warm.  Neurological:     Mental Status: She is alert and oriented to person, place, and time.  Psychiatric:        Behavior: Behavior normal.    Fetal Tracing: Baseline: 130 bpm Variability: Moderate  Accelerations: 15x15 Decelerations: None TOCO: contractions noted.    MAU Course  Procedures None  MDM  Discussed anterior placenta and its role in fetal movement She reports good fetal movement while in MAU. Recommend 10 movements in  a 2 hour period at least once per day.    Assessment and Plan   A:  1. Decreased fetal movement during pregnancy, antepartum, single or unspecified fetus   2. [redacted] weeks gestation of pregnancy   3. NST (non-stress test) reactive      P:  Dc home Keep OB appointment Return to MAU if symptoms worsen  Thersa Mohiuddin, Harolyn Rutherford, NP 08/30/2021 3:28 PM

## 2021-08-27 NOTE — MAU Note (Signed)
.  Sharon Fuller is a 21 y.o. at [redacted]w[redacted]d here in MAU reporting: she has had decreased fetal movement today. Went to appointment today and told her provider and they did a non stress test and told her everything was fine and then asked how she felt about that. Pt stated she did not think they did enough so she came here for a "second opinion." Denies any pain or cramping, leaking or vag bleeding.   Onset of complaint: today Pain score: 0 Vitals:   08/27/21 1900  BP: 104/60  Pulse: 86  Resp: 18  Temp: 98.3 F (36.8 C)     FHT:134 Lab orders placed from triage:

## 2021-09-03 DIAGNOSIS — Z3A38 38 weeks gestation of pregnancy: Secondary | ICD-10-CM | POA: Diagnosis not present

## 2021-09-03 DIAGNOSIS — O365999 Maternal care for other known or suspected poor fetal growth, unspecified trimester, other fetus: Secondary | ICD-10-CM | POA: Diagnosis not present

## 2021-09-03 DIAGNOSIS — R6889 Other general symptoms and signs: Secondary | ICD-10-CM | POA: Diagnosis not present

## 2021-09-09 ENCOUNTER — Inpatient Hospital Stay (HOSPITAL_COMMUNITY)
Admission: AD | Admit: 2021-09-09 | Discharge: 2021-09-12 | DRG: 807 | Disposition: A | Discharge status: Home / Self Care | Payer: BC Managed Care – PPO | Attending: Obstetrics & Gynecology | Admitting: Obstetrics & Gynecology

## 2021-09-09 DIAGNOSIS — Z23 Encounter for immunization: Secondary | ICD-10-CM

## 2021-09-09 DIAGNOSIS — Z3A39 39 weeks gestation of pregnancy: Secondary | ICD-10-CM

## 2021-09-10 ENCOUNTER — Inpatient Hospital Stay (HOSPITAL_COMMUNITY): Payer: BC Managed Care – PPO | Admitting: Anesthesiology

## 2021-09-10 ENCOUNTER — Other Ambulatory Visit: Payer: Self-pay

## 2021-09-10 ENCOUNTER — Encounter (HOSPITAL_COMMUNITY): Payer: Self-pay | Admitting: Obstetrics and Gynecology

## 2021-09-10 ENCOUNTER — Encounter: Payer: Self-pay | Admitting: Oncology

## 2021-09-10 DIAGNOSIS — Z23 Encounter for immunization: Secondary | ICD-10-CM | POA: Diagnosis not present

## 2021-09-10 DIAGNOSIS — O26893 Other specified pregnancy related conditions, third trimester: Secondary | ICD-10-CM | POA: Diagnosis present

## 2021-09-10 DIAGNOSIS — Z3A39 39 weeks gestation of pregnancy: Secondary | ICD-10-CM | POA: Diagnosis not present

## 2021-09-10 LAB — RPR: RPR Ser Ql: NONREACTIVE

## 2021-09-10 LAB — CBC
HCT: 32.4 % — ABNORMAL LOW (ref 36.0–46.0)
Hemoglobin: 10.5 g/dL — ABNORMAL LOW (ref 12.0–15.0)
MCH: 29.8 pg (ref 26.0–34.0)
MCHC: 32.4 g/dL (ref 30.0–36.0)
MCV: 92 fL (ref 80.0–100.0)
Platelets: 130 10*3/uL — ABNORMAL LOW (ref 150–400)
RBC: 3.52 MIL/uL — ABNORMAL LOW (ref 3.87–5.11)
RDW: 19.2 % — ABNORMAL HIGH (ref 11.5–15.5)
WBC: 6.4 10*3/uL (ref 4.0–10.5)
nRBC: 0 % (ref 0.0–0.2)

## 2021-09-10 LAB — TYPE AND SCREEN
ABO/RH(D): B POS
Antibody Screen: NEGATIVE

## 2021-09-10 MED ORDER — OXYCODONE HCL 5 MG PO TABS: 5.0000 mg | ORAL_TABLET | ORAL | Status: DC | PRN

## 2021-09-10 MED ORDER — WITCH HAZEL-GLYCERIN EX PADS
1.0000 | MEDICATED_PAD | CUTANEOUS | Status: DC | PRN
Start: 2021-09-10 — End: 2021-09-12

## 2021-09-10 MED ORDER — SENNOSIDES-DOCUSATE SODIUM 8.6-50 MG PO TABS
2.0000 | ORAL_TABLET | Freq: Every day | ORAL | Status: DC
  Administered 2021-09-11 – 2021-09-12 (×2): 2 via ORAL
  Filled 2021-09-10 (×2): qty 2

## 2021-09-10 MED ORDER — OXYCODONE-ACETAMINOPHEN 5-325 MG PO TABS: 1.0000 | ORAL_TABLET | ORAL | Status: DC | PRN

## 2021-09-10 MED ORDER — LIDOCAINE-EPINEPHRINE (PF) 1.5 %-1:200000 IJ SOLN
INTRAMUSCULAR | Status: DC | PRN
  Administered 2021-09-10: 5 mL via EPIDURAL

## 2021-09-10 MED ORDER — EPHEDRINE 5 MG/ML INJ
10.0000 mg | INTRAVENOUS | Status: AC | PRN
  Administered 2021-09-10 (×2): 10 mg via INTRAVENOUS

## 2021-09-10 MED ORDER — PHENYLEPHRINE 80 MCG/ML (10ML) SYRINGE FOR IV PUSH (FOR BLOOD PRESSURE SUPPORT)
80.0000 ug | PREFILLED_SYRINGE | INTRAVENOUS | Status: DC | PRN
  Administered 2021-09-10: 80 ug via INTRAVENOUS
  Filled 2021-09-10 (×2): qty 10

## 2021-09-10 MED ORDER — ONDANSETRON HCL 4 MG PO TABS: 4.0000 mg | ORAL_TABLET | ORAL | Status: DC | PRN

## 2021-09-10 MED ORDER — TETANUS-DIPHTH-ACELL PERTUSSIS 5-2.5-18.5 LF-MCG/0.5 IM SUSY
0.5000 mL | PREFILLED_SYRINGE | Freq: Once | INTRAMUSCULAR | Status: AC
  Administered 2021-09-11: 0.5 mL via INTRAMUSCULAR
  Filled 2021-09-10: qty 0.5

## 2021-09-10 MED ORDER — LACTATED RINGERS IV SOLN: INTRAVENOUS | Status: DC

## 2021-09-10 MED ORDER — DIPHENHYDRAMINE HCL 25 MG PO CAPS: 25.0000 mg | ORAL_CAPSULE | Freq: Four times a day (QID) | ORAL | Status: DC | PRN

## 2021-09-10 MED ORDER — PHENYLEPHRINE 80 MCG/ML (10ML) SYRINGE FOR IV PUSH (FOR BLOOD PRESSURE SUPPORT)
80.0000 ug | PREFILLED_SYRINGE | INTRAVENOUS | Status: AC | PRN
  Administered 2021-09-10 (×3): 80 ug via INTRAVENOUS

## 2021-09-10 MED ORDER — IBUPROFEN 600 MG PO TABS
600.0000 mg | ORAL_TABLET | Freq: Four times a day (QID) | ORAL | Status: DC
  Administered 2021-09-10 – 2021-09-12 (×7): 600 mg via ORAL
  Filled 2021-09-10 (×8): qty 1

## 2021-09-10 MED ORDER — ACETAMINOPHEN 325 MG PO TABS: 650.0000 mg | ORAL_TABLET | ORAL | Status: DC | PRN

## 2021-09-10 MED ORDER — FENTANYL-BUPIVACAINE-NACL 0.5-0.125-0.9 MG/250ML-% EP SOLN
12.0000 mL/h | EPIDURAL | Status: DC | PRN
  Administered 2021-09-10: 12 mL/h via EPIDURAL

## 2021-09-10 MED ORDER — OXYTOCIN BOLUS FROM INFUSION
333.0000 mL | Freq: Once | INTRAVENOUS | Status: AC
  Administered 2021-09-10: 333 mL via INTRAVENOUS

## 2021-09-10 MED ORDER — LACTATED RINGERS IV SOLN
500.0000 mL | Freq: Once | INTRAVENOUS | Status: AC
  Administered 2021-09-10: 500 mL via INTRAVENOUS

## 2021-09-10 MED ORDER — DIPHENHYDRAMINE HCL 50 MG/ML IJ SOLN: 12.5000 mg | INTRAMUSCULAR | Status: DC | PRN

## 2021-09-10 MED ORDER — COCONUT OIL OIL
1.0000 | TOPICAL_OIL | Status: DC | PRN
Start: 2021-09-10 — End: 2021-09-12

## 2021-09-10 MED ORDER — ZOLPIDEM TARTRATE 5 MG PO TABS: 5.0000 mg | ORAL_TABLET | Freq: Every evening | ORAL | Status: DC | PRN

## 2021-09-10 MED ORDER — FENTANYL CITRATE (PF) 100 MCG/2ML IJ SOLN: 50.0000 ug | INTRAMUSCULAR | Status: DC | PRN

## 2021-09-10 MED ORDER — EPHEDRINE 5 MG/ML INJ
10.0000 mg | INTRAVENOUS | Status: AC | PRN
  Administered 2021-09-10 (×2): 10 mg via INTRAVENOUS
  Filled 2021-09-10 (×2): qty 5

## 2021-09-10 MED ORDER — OXYTOCIN-SODIUM CHLORIDE 30-0.9 UT/500ML-% IV SOLN
1.0000 m[IU]/min | INTRAVENOUS | Status: DC
  Administered 2021-09-10: 2 m[IU]/min via INTRAVENOUS
  Filled 2021-09-10: qty 500

## 2021-09-10 MED ORDER — FENTANYL-BUPIVACAINE-NACL 0.5-0.125-0.9 MG/250ML-% EP SOLN
EPIDURAL | Status: AC
  Filled 2021-09-10: qty 250

## 2021-09-10 MED ORDER — ONDANSETRON HCL 4 MG/2ML IJ SOLN: 4.0000 mg | INTRAMUSCULAR | Status: DC | PRN

## 2021-09-10 MED ORDER — BENZOCAINE-MENTHOL 20-0.5 % EX AERO
1.0000 | INHALATION_SPRAY | CUTANEOUS | Status: DC | PRN
Start: 2021-09-10 — End: 2021-09-12

## 2021-09-10 MED ORDER — SOD CITRATE-CITRIC ACID 500-334 MG/5ML PO SOLN: 30.0000 mL | ORAL | Status: DC | PRN

## 2021-09-10 MED ORDER — ONDANSETRON HCL 4 MG/2ML IJ SOLN
4.0000 mg | Freq: Four times a day (QID) | INTRAMUSCULAR | Status: DC | PRN
  Administered 2021-09-10: 4 mg via INTRAVENOUS
  Filled 2021-09-10: qty 2

## 2021-09-10 MED ORDER — LIDOCAINE HCL (PF) 1 % IJ SOLN: 30.0000 mL | INTRAMUSCULAR | Status: DC | PRN

## 2021-09-10 MED ORDER — TERBUTALINE SULFATE 1 MG/ML IJ SOLN: 0.2500 mg | Freq: Once | INTRAMUSCULAR | Status: DC | PRN

## 2021-09-10 MED ORDER — OXYTOCIN-SODIUM CHLORIDE 30-0.9 UT/500ML-% IV SOLN: 2.5000 [IU]/h | INTRAVENOUS | Status: DC | PRN

## 2021-09-10 MED ORDER — PRENATAL MULTIVITAMIN CH
1.0000 | ORAL_TABLET | Freq: Every day | ORAL | Status: DC
  Administered 2021-09-11 – 2021-09-12 (×2): 1 via ORAL
  Filled 2021-09-10 (×2): qty 1

## 2021-09-10 MED ORDER — SIMETHICONE 80 MG PO CHEW: 80.0000 mg | CHEWABLE_TABLET | ORAL | Status: DC | PRN

## 2021-09-10 MED ORDER — OXYTOCIN-SODIUM CHLORIDE 30-0.9 UT/500ML-% IV SOLN
2.5000 [IU]/h | INTRAVENOUS | Status: DC
Start: 2021-09-10 — End: 2021-09-10

## 2021-09-10 MED ORDER — DIBUCAINE (PERIANAL) 1 % EX OINT: 1.0000 | TOPICAL_OINTMENT | CUTANEOUS | Status: DC | PRN

## 2021-09-10 MED ORDER — OXYCODONE-ACETAMINOPHEN 5-325 MG PO TABS: 2.0000 | ORAL_TABLET | ORAL | Status: DC | PRN

## 2021-09-10 MED ORDER — LACTATED RINGERS IV SOLN
500.0000 mL | INTRAVENOUS | Status: DC | PRN
  Administered 2021-09-10: 1000 mL via INTRAVENOUS
  Administered 2021-09-10: 500 mL via INTRAVENOUS

## 2021-09-10 MED ORDER — OXYCODONE HCL 5 MG PO TABS: 10.0000 mg | ORAL_TABLET | ORAL | Status: DC | PRN

## 2021-09-10 NOTE — Progress Notes (Signed)
Sharon Fuller is a 21 y.o. G2P1001 at [redacted]w[redacted]d by LMP admitted for active labor  Subjective: Patient is comfortable with epidural  Objective: BP (!) 90/47   Pulse 93   Temp 97.6 F (36.4 C) (Oral)   Resp 18   Ht 5\' 3"  (1.6 m)   Wt 71.2 kg   LMP 11/23/2020   SpO2 99%   BMI 27.81 kg/m  No intake/output data recorded.  FHT:  FHR: 145 bpm, variability: moderate,  accelerations:  Present,  decelerations:  Absent UC:   irregular, every 5-6 minutes SVE:   Dilation: 8 Effacement (%): 90 Station: 0 Exam by:: Dr [email or web address] AROM performed - clear fluid  Labs: Lab Results  Component Value Date   WBC 6.4 09/10/2021   HGB 10.5 (L) 09/10/2021   HCT 32.4 (L) 09/10/2021   MCV 92.0 09/10/2021   PLT 130 (L) 09/10/2021    Assessment / Plan: Augmentation of labor, progressing well  Labor: Progressing normally Preeclampsia:  no signs or symptoms of toxicity Fetal Wellbeing:  Category I Pain Control:  Epidural I/D:  n/a Anticipated MOD:  NSVD  09/12/2021, MD 09/10/2021, 9:54 AM

## 2021-09-10 NOTE — Anesthesia Preprocedure Evaluation (Signed)
Anesthesia Evaluation  Patient identified by MRN, date of birth, ID band Patient awake    Reviewed: Allergy & Precautions, NPO status , Patient's Chart, lab work & pertinent test results  Airway Mallampati: I  TM Distance: >3 FB Neck ROM: Full    Dental no notable dental hx.    Pulmonary neg pulmonary ROS, Patient abstained from smoking.,    Pulmonary exam normal        Cardiovascular negative cardio ROS   Rhythm:Regular Rate:Normal     Neuro/Psych negative neurological ROS  negative psych ROS   GI/Hepatic negative GI ROS, Neg liver ROS,   Endo/Other  negative endocrine ROS  Renal/GU   negative genitourinary   Musculoskeletal negative musculoskeletal ROS (+)   Abdominal Normal abdominal exam  (+)   Peds  Hematology  (+) Blood dyscrasia, anemia ,   Anesthesia Other Findings   Reproductive/Obstetrics (+) Pregnancy                             Anesthesia Physical Anesthesia Plan  ASA: 2  Anesthesia Plan: Epidural   Post-op Pain Management:    Induction:   PONV Risk Score and Plan: 2 and Treatment may vary due to age or medical condition  Airway Management Planned: Natural Airway  Additional Equipment: None  Intra-op Plan:   Post-operative Plan:   Informed Consent: I have reviewed the patients History and Physical, chart, labs and discussed the procedure including the risks, benefits and alternatives for the proposed anesthesia with the patient or authorized representative who has indicated his/her understanding and acceptance.     Dental advisory given  Plan Discussed with:   Anesthesia Plan Comments: (Lab Results      Component                Value               Date                      WBC                      6.4                 09/10/2021                HGB                      10.5 (L)            09/10/2021                HCT                      32.4 (L)             09/10/2021                MCV                      92.0                09/10/2021                PLT                      130 (L)  09/10/2021          )        Anesthesia Quick Evaluation

## 2021-09-10 NOTE — H&P (Addendum)
Sharon Fuller is a 21 y.o. female presenting for labor. OB History     Gravida  2   Para  1   Term  1   Preterm  0   AB  0   Living  1      SAB  0   IAB  0   Ectopic  0   Multiple  0   Live Births  1          Past Medical History:  Diagnosis Date   Allergy    Anemia    History of chicken pox    Medical history non-contributory    Past Surgical History:  Procedure Laterality Date   NO PAST SURGERIES     WISDOM TOOTH EXTRACTION     Family History: family history includes Asthma in her father; Breast cancer in her maternal grandmother; Kidney failure in her maternal grandfather and mother. Social History:  reports that she has never smoked. She has never used smokeless tobacco. She reports that she does not currently use alcohol. She reports that she does not use drugs.     Maternal Diabetes: No Genetic Screening: Normal Maternal Ultrasounds/Referrals: Normal Fetal Ultrasounds or other Referrals:  None Maternal Substance Abuse:  No Significant Maternal Medications:  None Significant Maternal Lab Results:  Group B Strep negative Number of Prenatal Visits:greater than 3 verified prenatal visits Other Comments:  None  Review of Systems No F/C/N/V/D  History Dilation: 3.5 Effacement (%): 80 Station: -2 Exam by:: Quintella Baton RNC Blood pressure 107/61, pulse 79, temperature 97.9 F (36.6 C), resp. rate 17, height 5\' 3"  (1.6 m), weight 71.2 kg, last menstrual period 11/23/2020, SpO2 100 %, unknown if currently breastfeeding.  FHT 130, mod variability, accels, variable x1 otherwise no decels Toco q2-43min  Exam Physical Exam  Prenatal labs: ABO, Rh: --/--/B POS (06/29 1426) Antibody: NEG (06/29 1426) Rubella:  Immune RPR:   NR HBsAg:   Negative HIV:   NR GBS: NEGATIVE/-- (06/29 2043)   Assessment/Plan: P1 at 39 5/7wks presenting in labor.  Pain medication upon request.  FHT cat 1.     2044 09/10/2021, 2:27 AM

## 2021-09-10 NOTE — Anesthesia Procedure Notes (Signed)
Epidural Patient location during procedure: OB Start time: 09/10/2021 4:02 AM End time: 09/10/2021 4:10 AM  Staffing Anesthesiologist: Atilano Median, DO Performed: anesthesiologist   Preanesthetic Checklist Completed: patient identified, IV checked, site marked, risks and benefits discussed, surgical consent, monitors and equipment checked, pre-op evaluation and timeout performed  Epidural Patient position: sitting Prep: ChloraPrep Patient monitoring: heart rate, continuous pulse ox and blood pressure Approach: midline Location: L3-L4 Injection technique: LOR saline  Needle:  Needle type: Tuohy  Needle gauge: 17 G Needle length: 9 cm Needle insertion depth: 6 cm Catheter type: closed end flexible Catheter size: 20 Guage Catheter at skin depth: 11 cm Test dose: negative and 1.5% lidocaine with Epi 1:200 K  Assessment Events: blood not aspirated, injection not painful, no injection resistance and no paresthesia  Additional Notes Patient identified. Risks/Benefits/Options discussed with patient including but not limited to bleeding, infection, nerve damage, paralysis, failed block, incomplete pain control, headache, blood pressure changes, nausea, vomiting, reactions to medications, itching and postpartum back pain. Confirmed with bedside nurse the patient's most recent platelet count. Confirmed with patient that they are not currently taking any anticoagulation, have any bleeding history or any family history of bleeding disorders. Patient expressed understanding and wished to proceed. All questions were answered. Sterile technique was used throughout the entire procedure. Please see nursing notes for vital signs. Test dose was given through epidural catheter and negative prior to continuing to dose epidural or start infusion. Warning signs of high block given to the patient including shortness of breath, tingling/numbness in hands, complete motor block, or any concerning  symptoms with instructions to call for help. Patient was given instructions on fall risk and not to get out of bed. All questions and concerns addressed with instructions to call with any issues or inadequate analgesia.    Reason for block:procedure for pain

## 2021-09-10 NOTE — MAU Note (Signed)
.  Sharon Fuller is a 21 y.o. at [redacted]w[redacted]d here in MAU reporting ctxs since Sat but stronger since 1700. Denies LOF or VB. Good FM. Was 2cm in office at 1600  Onset of complaint: 1700 Pain score: 7 Vitals:   09/10/21 0017 09/10/21 0018  BP:  107/61  Pulse: 79   Resp: 17   Temp:  97.9 F (36.6 C)  SpO2: 100%      FHT:133 Lab orders placed from triage:  labor eval for mau

## 2021-09-10 NOTE — Lactation Note (Signed)
This note was copied from a baby's chart. Lactation Consultation Note  Patient Name: Girl Darcus Edds [id number] Date: 09/10/2021 Reason for consult: L&D Initial assessment Age:23 hours  P2, Baby had breastfed x1 when [facility] entered room. Baby was cueing and latched with ease. Lactation to follow up on MBU.   Maternal Data Does the patient have breastfeeding experience prior to this delivery?: Yes How long did the patient breastfeed?: 13 mos.  Feeding Mother's Current Feeding Choice: Breast Milk  LATCH Score Latch: Grasps breast easily, tongue down, lips flanged, rhythmical sucking.  Audible Swallowing: A few with stimulation  Type of Nipple: Everted at rest and after stimulation  Comfort (Breast/Nipple): Soft / non-tender  Hold (Positioning): No assistance needed to correctly position infant at breast.  LATCH Score: 9    Interventions Interventions: Skin to skin;Education Consult Status Consult Status: Follow-up from L&D    Dahlia Byes [facility] 09/10/2021, 12:30 PM

## 2021-09-11 ENCOUNTER — Encounter: Payer: Self-pay | Admitting: Oncology

## 2021-09-11 LAB — CBC
HCT: 28.8 % — ABNORMAL LOW (ref 36.0–46.0)
Hemoglobin: 9.6 g/dL — ABNORMAL LOW (ref 12.0–15.0)
MCH: 30.1 pg (ref 26.0–34.0)
MCHC: 33.3 g/dL (ref 30.0–36.0)
MCV: 90.3 fL (ref 80.0–100.0)
Platelets: 114 10*3/uL — ABNORMAL LOW (ref 150–400)
RBC: 3.19 MIL/uL — ABNORMAL LOW (ref 3.87–5.11)
RDW: 19.1 % — ABNORMAL HIGH (ref 11.5–15.5)
WBC: 7.8 10*3/uL (ref 4.0–10.5)
nRBC: 0 % (ref 0.0–0.2)

## 2021-09-11 MED ORDER — IBUPROFEN 600 MG PO TABS
600.0000 mg | ORAL_TABLET | Freq: Four times a day (QID) | ORAL | 0 refills | Status: AC
  Filled 2021-09-11: qty 30, 8d supply, fill #0

## 2021-09-11 NOTE — Lactation Note (Signed)
This note was copied from a baby's chart. Lactation Consultation Note  Patient Name: Sharon Fuller [id number] Date: 09/11/2021 Reason for consult: Initial assessment;Term Age:23 hours   P2: Term infant at 39+5 weeks Feeding preference: Breast  RN requested latch assistance.    Birth parent able to hand express colostrum.  She has been spoon feeding drops to "[name]."  Assisted to latch easily in the football hold and observed her feeding for 10 minutes with gentle intermittent stimulation; few swallows noted.  After feeding, placed "[name]" STS on birth parent's chest where she fell asleep.  Encouraged to feed on cue and to call for latch assistance as needed.  Support person present.   Maternal Data Has patient been taught Hand Expression?: Yes Does the patient have breastfeeding experience prior to this delivery?: Yes How long did the patient breastfeed?: 14 months  Feeding Mother's Current Feeding Choice: Breast Milk  LATCH Score Latch: Repeated attempts needed to sustain latch, nipple held in mouth throughout feeding, stimulation needed to elicit sucking reflex.  Audible Swallowing: A few with stimulation  Type of Nipple: Everted at rest and after stimulation (Short shafted)  Comfort (Breast/Nipple): Soft / non-tender  Hold (Positioning): Assistance needed to correctly position infant at breast and maintain latch.  LATCH Score: 7   Lactation Tools Discussed/Used    Interventions Interventions: Breast feeding basics reviewed;Assisted with latch;Skin to skin;Breast massage;Hand express;Breast compression;Expressed milk;Position options;Support pillows;Adjust position;Education  Discharge Pump: Personal WIC Program: No  Consult Status Consult Status: Follow-up Date: 09/12/21 Follow-up type: In-patient    Conception Sharon Fuller 09/11/2021, 3:55 AM

## 2021-09-11 NOTE — Discharge Summary (Signed)
Postpartum Discharge Summary     Patient Name: Sharon Fuller DOB: Oct 01, 2000 MRN: [id number]  Date of admission: 09/09/2021 Delivery date: 09/10/2021 Delivering provider: Sanjuana Kava  Date of discharge: 09/12/2021  Admitting diagnosis: Normal labor [O80, Z37.9] Intrauterine pregnancy: [redacted]w[redacted]d    Secondary diagnosis:  Principal Problem:   Normal labor     Discharge diagnosis: Term Pregnancy Delivered                                              Post partum procedures: None  Augmentation: N/A Complications: None  Hospital course: Onset of Labor With Vaginal Delivery      21y.o. yo [id number] admitted in Latent Labor on 09/09/2021. Patient had an uncomplicated labor course as follows:  Membrane Rupture Time/Date: 9:48 AM ,09/10/2021   Delivery Method:Vaginal, Spontaneous  Episiotomy: None  Lacerations:  None  Patient had an uncomplicated postpartum course.  She is ambulating, tolerating a regular diet, passing flatus, and urinating well. Patient is discharged home in stable condition on 09/12/21.  Newborn Data: Birth date:09/10/2021  Birth time:11:48 AM  Gender:Female  "Sharon Fuller" Living status:Living  Apgars:9 ,9  Weight:3500 g   Magnesium Sulfate received: No BMZ received: No Rhophylac:No MMR:No T-DaP:Given prenatally Flu: No Transfusion:No  Physical exam  Vitals:   09/10/21 2300 09/11/21 0215 09/11/21 1440 09/11/21 2039  BP: 108/65 105/66 107/67 102/62  Pulse: 81 70 79 75  Resp: _0 Temp:  98 F (36.7 C) 98.5 F (36.9 C) 99.2 F (37.3 C)  TempSrc:  Oral Oral Oral  SpO2:   100% 100%  Weight:      Height:       General: alert, cooperative, and no distress Lochia: appropriate Uterine Fundus: firm Incision: N/A DVT Evaluation: No evidence of DVT seen on physical exam. No significant calf/ankle edema. Labs: Lab Results  Component Value Date   WBC 7.8 09/11/2021   HGB 9.6 (L) 09/11/2021   HCT 28.8 (L) 09/11/2021   MCV 90.3 09/11/2021    PLT 114 (L) 09/11/2021      Latest Ref Rng & Units 07/24/2021    2:26 PM  CMP  Glucose 70 - 99 mg/dL 77   BUN 6 - 20 mg/dL 6   Creatinine 0.44 - 1.00 mg/dL 0.56   Sodium 135 - 145 mmol/L 136   Potassium 3.5 - 5.1 mmol/L 3.8   Chloride 98 - 111 mmol/L 105   CO2 22 - 32 mmol/L 22   Calcium 8.9 - 10.3 mg/dL 9.0   Total Protein 6.5 - 8.1 g/dL 7.5   Total Bilirubin 0.3 - 1.2 mg/dL 1.1   Alkaline Phos 38 - 126 U/L 110   AST 15 - 41 U/L 14   ALT 0 - 44 U/L 11    Edinburgh Score:    09/11/2021   11:09 AM  Edinburgh Postnatal Depression Scale Screening Tool  I have been able to laugh and see the funny side of things. 0  I have looked forward with enjoyment to things. 0  I have blamed myself unnecessarily when things went wrong. 0  I have been anxious or worried for no good reason. 1  I have felt scared or panicky for no good reason. 0  Things have been getting on top of me. 0  I have been so unhappy that I  have had difficulty sleeping. 0  I have felt sad or miserable. 0  I have been so unhappy that I have been crying. 0  The thought of harming myself has occurred to me. 0  Edinburgh Postnatal Depression Scale Total 1    After visit meds:  Allergies as of 09/11/2021   No Known Allergies      Medication List     TAKE these medications    cholecalciferol 25 MCG (1000 UNIT) tablet Commonly known as: VITAMIN D3 Take 2,000 Units by mouth daily.   ibuprofen 600 MG tablet Commonly known as: ADVIL Take 1 tablet (600 mg total) by mouth every 6 (six) hours. Start taking on: September 12, 2021   iron polysaccharides 150 MG capsule Commonly known as: NIFEREX Take 1 capsule (150 mg total) by mouth every other day.   prenatal multivitamin Tabs tablet Take 1 tablet by mouth daily at 12 noon.       Discharge home in stable condition Infant Feeding: Breast Infant Disposition:home with mother Discharge instruction: per After Visit Summary and Postpartum booklet. Activity:  Advance as tolerated. Pelvic rest for 6 weeks.  Diet: routine diet Future Appointments: Future Appointments  Date Time Provider [place]  11/11/2021 11:00 AM [facility] None  11/11/2021 11:30 AM Sindy Guadeloupe, MD [facility] None   Follow up Visit:  Follow-up Information     Waymon Amato, MD. Schedule an appointment as soon as possible for a visit in 6 week(s).   Specialty: Obstetrics and Gynecology Why: Postpartum follow up. Contact information: [address] [place] [address] [phone]                09/12/2021 Archie Endo, MD.

## 2021-09-11 NOTE — Progress Notes (Signed)
Post Partum Day 1 Subjective: Patient doing well, w/o complaints.  She states her bleeding is minimal.  Pain is well controlled.  Patient is breastfeeding, mother and baby are bonding well.   Objective: Blood pressure 105/66, pulse 70, temperature 98 F (36.7 C), temperature source Oral, resp. rate 16, height 5\' 3"  (1.6 m), weight 71.2 kg, last menstrual period 11/23/2020, SpO2 98 %, unknown if currently breastfeeding.  Physical Exam:  General: alert, cooperative, and no distress Lochia: appropriate Uterine Fundus: firm U-1 Incision: N/A DVT Evaluation: No evidence of DVT seen on physical exam. Negative Homan's sign.  Recent Labs    09/10/21 0258 09/11/21 0421  HGB 10.5* 9.6*  HCT 32.4* 28.8*    Assessment/Plan: Continue routine postpartum care Plan for discharge tomorrow and Breastfeeding   LOS: 1 day   09/13/21, MD 09/11/2021, 12:28 PM

## 2021-09-12 ENCOUNTER — Encounter: Payer: Self-pay | Admitting: Oncology

## 2021-09-12 ENCOUNTER — Other Ambulatory Visit (HOSPITAL_COMMUNITY): Payer: Self-pay

## 2021-09-12 NOTE — Lactation Note (Signed)
This note was copied from a baby's chart. Lactation Consultation Note  Patient Name: Sharon Fuller FWYOV'Z Date: 09/12/2021 Reason for consult: Follow-up assessment;Term;Difficult latch Age:23 hours  LC in to visit with P2 birth parent of term baby on day of discharge.  Baby is at a 4% weight loss with good output.   Breasts are filling today and parent did some pumping using her hand's free pump.   Baby unswaddled and undressed for STS and assisted with latching.  Breast are filling and nipples are flattened.  Baby trying to latch but unable to sustain.  Parents reports baby has been on and off the breast without being able to sustain. LC initiated a 24 mm nipple shield, showing parents how to properly apply to pull nipple well into shield.  Baby able to attain a wider and deeper latch to the breast.  Baby sucking with deep jaw extensions and swallowing identified.  Assisted with breast compression to increase milk transfer.   Basics reviewed. Pre-pumping/hand expression prior to nipple shield application. Post feeding pumping recommended after every other feeding to support milk supply. Talked about OP f/u  Engorgement prevention and treatment reviewed.  LATCH Score Latch: Grasps breast easily, tongue down, lips flanged, rhythmical sucking. (with nipple shield)  Audible Swallowing: Spontaneous and intermittent  Type of Nipple: Flat  Comfort (Breast/Nipple): Soft / non-tender (breasts filling)  Hold (Positioning): Assistance needed to correctly position infant at breast and maintain latch.  LATCH Score: 8   Lactation Tools Discussed/Used Tools: Nipple Shields Nipple shield size: 24  Interventions Interventions: Breast feeding basics reviewed;Assisted with latch;Skin to skin;Breast massage;Hand express;Pre-pump if needed;Breast compression;Adjust position;Support pillows;Position options;Expressed milk  Discharge Discharge Education: Engorgement and breast care;Warning  signs for feeding baby;Outpatient recommendation;Outpatient Epic message sent Pump: Hands Free  Consult Status Consult Status: Complete Date: 09/12/21 Follow-up type: Out-patient    Sharon Fuller 09/12/2021, 9:48 AM

## 2021-09-12 NOTE — Anesthesia Postprocedure Evaluation (Signed)
Anesthesia Post Note  Patient: Sharon Fuller  Procedure(s) Performed: AN AD HOC LABOR EPIDURAL     Patient location during evaluation: Mother Baby Anesthesia Type: Epidural Level of consciousness: awake and alert and oriented Pain management: pain level controlled Vital Signs Assessment: post-procedure vital signs reviewed and stable Respiratory status: spontaneous breathing and nonlabored ventilation Cardiovascular status: blood pressure returned to baseline and stable Postop Assessment: no headache, epidural receding and no backache Anesthetic complications: no   No notable events documented.  Last Vitals: There were no vitals filed for this visit.  Last Pain: There were no vitals filed for this visit.               Wajiha Versteeg L Rowan Blaker

## 2021-09-18 ENCOUNTER — Encounter: Payer: Self-pay | Admitting: Oncology

## 2021-09-19 ENCOUNTER — Encounter (HOSPITAL_COMMUNITY): Payer: BC Managed Care – PPO

## 2021-09-20 ENCOUNTER — Telehealth (HOSPITAL_COMMUNITY): Payer: Self-pay | Admitting: *Deleted

## 2021-09-20 NOTE — Telephone Encounter (Signed)
Attempted hospital discharge follow-up call. Left message for patient to return RN call with any questions or concerns. Deforest Hoyles, RN, 09/20/21, [phone]

## 2021-09-23 ENCOUNTER — Encounter: Payer: Self-pay | Admitting: Oncology

## 2021-10-31 DIAGNOSIS — Z3040 Encounter for surveillance of contraceptives, unspecified: Secondary | ICD-10-CM | POA: Diagnosis not present

## 2021-11-10 ENCOUNTER — Other Ambulatory Visit: Payer: Self-pay

## 2021-11-10 DIAGNOSIS — E538 Deficiency of other specified B group vitamins: Secondary | ICD-10-CM

## 2021-11-10 DIAGNOSIS — O99013 Anemia complicating pregnancy, third trimester: Secondary | ICD-10-CM

## 2021-11-11 ENCOUNTER — Inpatient Hospital Stay: Payer: BC Managed Care – PPO | Admitting: Oncology

## 2021-11-11 ENCOUNTER — Inpatient Hospital Stay: Payer: BC Managed Care – PPO | Attending: Oncology

## 2021-11-11 ENCOUNTER — Encounter: Payer: Self-pay | Admitting: Oncology

## 2022-03-23 ENCOUNTER — Encounter: Payer: Self-pay | Admitting: Oncology

## 2022-06-24 DIAGNOSIS — N911 Secondary amenorrhea: Secondary | ICD-10-CM | POA: Diagnosis not present

## 2022-06-24 DIAGNOSIS — Z3201 Encounter for pregnancy test, result positive: Secondary | ICD-10-CM | POA: Diagnosis not present

## 2022-07-01 DIAGNOSIS — O3680X9 Pregnancy with inconclusive fetal viability, other fetus: Secondary | ICD-10-CM | POA: Diagnosis not present

## 2022-07-01 DIAGNOSIS — R350 Frequency of micturition: Secondary | ICD-10-CM | POA: Diagnosis not present

## 2022-07-01 DIAGNOSIS — O021 Missed abortion: Secondary | ICD-10-CM | POA: Diagnosis not present

## 2022-08-03 DIAGNOSIS — O039 Complete or unspecified spontaneous abortion without complication: Secondary | ICD-10-CM | POA: Diagnosis not present

## 2022-08-03 DIAGNOSIS — Z30013 Encounter for initial prescription of injectable contraceptive: Secondary | ICD-10-CM | POA: Diagnosis not present

## 2022-09-19 DIAGNOSIS — R102 Pelvic and perineal pain: Secondary | ICD-10-CM | POA: Diagnosis not present

## 2022-09-19 DIAGNOSIS — O26899 Other specified pregnancy related conditions, unspecified trimester: Secondary | ICD-10-CM | POA: Diagnosis not present

## 2022-09-19 DIAGNOSIS — G8929 Other chronic pain: Secondary | ICD-10-CM | POA: Diagnosis not present

## 2022-09-20 ENCOUNTER — Inpatient Hospital Stay (HOSPITAL_COMMUNITY): Payer: BC Managed Care – PPO

## 2022-09-20 ENCOUNTER — Inpatient Hospital Stay (HOSPITAL_COMMUNITY)
Admission: AD | Admit: 2022-09-20 | Discharge: 2022-09-20 | Disposition: A | Discharge status: Home / Self Care | Payer: BC Managed Care – PPO | Attending: Obstetrics and Gynecology | Admitting: Obstetrics and Gynecology

## 2022-09-20 ENCOUNTER — Encounter: Payer: Self-pay | Admitting: Oncology

## 2022-09-20 ENCOUNTER — Encounter (HOSPITAL_COMMUNITY): Payer: Self-pay | Admitting: Obstetrics & Gynecology

## 2022-09-20 DIAGNOSIS — R109 Unspecified abdominal pain: Secondary | ICD-10-CM

## 2022-09-20 DIAGNOSIS — N939 Abnormal uterine and vaginal bleeding, unspecified: Secondary | ICD-10-CM | POA: Diagnosis not present

## 2022-09-20 DIAGNOSIS — Z3A01 Less than 8 weeks gestation of pregnancy: Secondary | ICD-10-CM

## 2022-09-20 DIAGNOSIS — O039 Complete or unspecified spontaneous abortion without complication: Secondary | ICD-10-CM

## 2022-09-20 DIAGNOSIS — O0389 Complete or unspecified spontaneous abortion with other complications: Secondary | ICD-10-CM | POA: Diagnosis not present

## 2022-09-20 LAB — COMPREHENSIVE METABOLIC PANEL
ALT: 13 U/L (ref 0–44)
AST: 15 U/L (ref 15–41)
Albumin: 3.8 g/dL (ref 3.5–5.0)
Alkaline Phosphatase: 60 U/L (ref 38–126)
Anion gap: 9 (ref 5–15)
BUN: 10 mg/dL (ref 6–20)
CO2: 27 mmol/L (ref 22–32)
Calcium: 9.2 mg/dL (ref 8.9–10.3)
Chloride: 105 mmol/L (ref 98–111)
Creatinine, Ser: 0.83 mg/dL (ref 0.44–1.00)
GFR, Estimated: >60 mL/min (ref >60)
Glucose, Bld: 94 mg/dL (ref 70–99)
Potassium: 3.4 mmol/L — ABNORMAL LOW (ref 3.5–5.1)
Sodium: 141 mmol/L (ref 135–145)
Total Bilirubin: 0.9 mg/dL (ref 0.3–1.2)
Total Protein: 6.9 g/dL (ref 6.5–8.1)

## 2022-09-20 LAB — HCG, QUANTITATIVE, PREGNANCY: hCG, Beta Chain, Quant, S: 3 m[IU]/mL (ref <5)

## 2022-09-20 LAB — CBC
HCT: 30.6 % — ABNORMAL LOW (ref 36.0–46.0)
Hemoglobin: 10.1 g/dL — ABNORMAL LOW (ref 12.0–15.0)
MCH: 29.7 pg (ref 26.0–34.0)
MCHC: 33 g/dL (ref 30.0–36.0)
MCV: 90 fL (ref 80.0–100.0)
Platelets: 199 10*3/uL (ref 150–400)
RBC: 3.4 MIL/uL — ABNORMAL LOW (ref 3.87–5.11)
RDW: 11.4 % — ABNORMAL LOW (ref 11.5–15.5)
WBC: 3.9 10*3/uL — ABNORMAL LOW (ref 4.0–10.5)
nRBC: 0 % (ref 0.0–0.2)

## 2022-09-20 NOTE — MAU Provider Note (Cosign Needed Addendum)
History     CSN: [id number]  Arrival date and time: 09/20/22 2041   None     No chief complaint on file.  HPI Kieonna Teson is a 22 y.o. [id number] who presents today for heavy vaginal bleeding. She was seen in May 2024 for amenorrhea, where an ultrasound showed "EDC 02/13/23, 7 4/7 weeks, Cardiac activity absent, Yolk sac not visualized, Embryo not visualized. Gestational sac/No fetal pole seen." She was diagnosed with a missed abortion on 07/28/22. She was offered expectant management vs medication vs surgical management of missed Ab and chose to proceed with **   . Her b-hCG was trended by her primary OB/GYN [facility]) and was last measured at 6 (yesterday). She continued to experience vaginal bleeding and began experiencing pain yesterday, which prompted ED visit to [facility] ED yesterday (8/24). There, she had a b-hCG of 6, HgB of 10.7, and a U/S showed a 3.4cm heterogenous endometrium containing a "complex 2.8 x 4.8 x 3.4 cm area which could represent blood clot versus retained products of conception". Case was discussed with on-call OB, who advised ok for d/c and further management outpatient.   Today she presents to MAU for ongoing heavy vaginal bleeding and cramping. Patient states that she passed a large "hard, blood clot" earlier and states that since then her bleeding has been lighter and her pain is a little better, but she wanted to make sure she was ok. She currently rates pain a 8/10 and states that she took ibuprofen earlier with minimal relief. She reports bright red heavy bleeding but denies saturating more than a pad an hour. She denies abnormal vaginal discharge or urinary symptoms.   Past Medical History:  Diagnosis Date   Allergy    Anemia    History of chicken pox    Medical history non-contributory     Past Surgical History:  Procedure Laterality Date   WISDOM TOOTH EXTRACTION      Family History  Problem Relation Age of Onset   Kidney failure Mother     Breast cancer Maternal Grandmother    Kidney failure Maternal Grandfather    Asthma Father     Social History   Tobacco Use   Smoking status: Never   Smokeless tobacco: Never  Vaping Use   Vaping status: Former   Substances: Nicotine, Flavoring  Substance Use Topics   Alcohol use: Not Currently    Comment: not during pregnancy   Drug use: No    Allergies: No Known Allergies  Medications Prior to Admission  Medication Sig Dispense Refill Last Dose   cholecalciferol (VITAMIN D3) 25 MCG (1000 UNIT) tablet Take 2,000 Units by mouth daily.      ibuprofen (ADVIL) 600 MG tablet Take 1 tablet (600 mg total) by mouth every 6 (six) hours. 30 tablet 0    iron polysaccharides (NIFEREX) 150 MG capsule Take 1 capsule (150 mg total) by mouth every other day. (Patient not taking: Reported on 09/10/2021) 30 capsule 3    Prenatal Vit-Fe Fumarate-FA (PRENATAL MULTIVITAMIN) TABS tablet Take 1 tablet by mouth daily at 12 noon.       Review of Systems  Constitutional:  Negative for chills, fatigue and fever.  Eyes:  Negative for pain and visual disturbance.  Respiratory:  Negative for apnea, shortness of breath and wheezing.   Cardiovascular:  Negative for chest pain and palpitations.  Gastrointestinal:  Positive for abdominal pain. Negative for constipation, diarrhea, nausea and vomiting.  Genitourinary:  Positive for pelvic pain,  vaginal bleeding, vaginal discharge and vaginal pain. Negative for difficulty urinating and dysuria.  Musculoskeletal:  Positive for back pain.  Neurological:  Negative for seizures, weakness and headaches.  Psychiatric/Behavioral:  Negative for suicidal ideas.   All other systems reviewed and are negative.  Physical Exam   unknown if currently breastfeeding.  Physical Exam Vitals and nursing note reviewed.  Constitutional:      General: She is not in acute distress.    Appearance: Normal appearance.  HENT:     Head: Normocephalic.  Cardiovascular:     Rate  and Rhythm: Normal rate and regular rhythm.  Pulmonary:     Effort: Pulmonary effort is normal.  Abdominal:     Palpations: Abdomen is soft.  Musculoskeletal:        General: No swelling.     Cervical back: Normal range of motion.  Skin:    General: Skin is warm and dry.     Capillary Refill: Capillary refill takes 2 to 3 seconds.  Neurological:     Mental Status: She is alert and oriented to person, place, and time.  Psychiatric:        Mood and Affect: Mood normal.     MAU Course   Orders Placed This Encounter  Procedures   US PELVIC COMPLETE WITH TRANSVAGINAL   CBC   Comprehensive metabolic panel   hCG, quantitative, pregnancy   Discharge patient   No orders of the defined types were placed in this encounter.  Results for orders placed or performed during the hospital encounter of 09/20/22 (from the past 24 hour(s))  CBC     Status: Abnormal   Collection Time: 09/20/22  9:12 PM  Result Value Ref Range   WBC 3.9 (L) 4.0 - 10.5 K/uL   RBC 3.40 (L) 3.87 - 5.11 MIL/uL   Hemoglobin 10.1 (L) 12.0 - 15.0 g/dL   HCT 40.9 (L) 81.1 - 91.4 %   MCV 90.0 80.0 - 100.0 fL   MCH 29.7 26.0 - 34.0 pg   MCHC 33.0 30.0 - 36.0 g/dL   RDW 78.2 (L) 95.6 - 21.3 %   Platelets 199 150 - 400 K/uL   nRBC 0.0 0.0 - 0.2 %  Comprehensive metabolic panel     Status: Abnormal   Collection Time: 09/20/22  9:12 PM  Result Value Ref Range   Sodium 141 135 - 145 mmol/L   Potassium 3.4 (L) 3.5 - 5.1 mmol/L   Chloride 105 98 - 111 mmol/L   CO2 27 22 - 32 mmol/L   Glucose, Bld 94 70 - 99 mg/dL   BUN 10 6 - 20 mg/dL   Creatinine, Ser 0.86 0.44 - 1.00 mg/dL   Calcium 9.2 8.9 - 57.8 mg/dL   Total Protein 6.9 6.5 - 8.1 g/dL   Albumin 3.8 3.5 - 5.0 g/dL   AST 15 15 - 41 U/L   ALT 13 0 - 44 U/L   Alkaline Phosphatase 60 38 - 126 U/L   Total Bilirubin 0.9 0.3 - 1.2 mg/dL   GFR, Estimated >46 >96 mL/min   Anion gap 9 5 - 15  hCG, quantitative, pregnancy     Status: None   Collection Time: 09/20/22   9:12 PM  Result Value Ref Range   hCG, Beta Chain, Quant, S 3 <5 mIU/mL   US PELVIC COMPLETE WITH TRANSVAGINAL  Result Date: 09/20/2022 CLINICAL DATA:  Spontaneous abortion 2 months ago with persistent heavy vaginal bleeding. EXAM: TRANSABDOMINAL AND TRANSVAGINAL ULTRASOUND OF PELVIS  TECHNIQUE: Both transabdominal and transvaginal ultrasound examinations of the pelvis were performed. Transabdominal technique was performed for global imaging of the pelvis including uterus, ovaries, adnexal regions, and pelvic cul-de-sac. It was necessary to proceed with endovaginal exam following the transabdominal exam to visualize the endometrium. COMPARISON:  None Available. FINDINGS: Uterus Measurements: 10.3 cm x 5.7 cm x 6.9 cm = volume: 202.5 mL. No fibroids or other mass visualized. Endometrium Thickness: 4.5 mm.  No focal abnormality visualized. Right ovary Measurements: 5.0 cm x 2.1 cm x 2.2 cm = volume: 11.6 mL. Normal appearance/no adnexal mass. Left ovary Measurements: 5.2 cm x 1.8 cm x 2.1 cm = volume: 10.7 mL. Normal appearance/no adnexal mass. Other findings There is a small amount of pelvic free fluid. IMPRESSION: 1. Small amount of pelvic free fluid, likely physiologic. 2. Otherwise, unremarkable pelvic ultrasound. Electronically Signed   By: Aram Candela M.D.   On: 09/20/2022 22:40    MDM - CBC revealed a low normal WBC. Low suspicion for infection  - Hgb 10.1 and platelet count 199. Patient hemodynamically stable  -  Mildly low potassium otherwise normal CMP  - Quant levels 3- indicative of non-pregnant.  - [place] results revealed a small amount of free fluid and otherwise normal pelvic US. Low suspicion for retained products.  - plan for discharge.   Assessment and Plan   1. Vaginal bleeding   2. Abdominal cramping   3. SAB (spontaneous abortion)    - Reviewed results with patient and likelihood of passage of tissue that occurred earlier were likely the "retained products" that was  recently seen on US imaging.  - Recommended that patient continue to be followed by [facility]. Patient verbalized understanding.  - Reviewed worsening signs and return precautions.  - Reviewed that bleeding and abdominal pain should continue to improve. Encouraged the use of PO Tylenol and Ibuprofen as needed for pain.  - Patient discharged home in stable condition and may return to MAU as needed.   Shantonette Danella Deis) Suzie Portela, MSN, CNM  [facility]  09/21/2022 6:52 AM

## 2022-09-20 NOTE — MAU Note (Signed)
..  Sharon Fuller is a 22 y.o. at Unknown here in MAU reporting: miscarriage in June and has had follow-up labs every hour.  Reports she is filling 3 pads in an hour.  Pelvic pain that began an hour ago.   Pain score: 8/10 Vitals:   09/20/22 2135  BP: 119/78  Pulse: 68  Resp: 18  Temp: 98.3 F (36.8 C)  SpO2: 100%

## 2023-01-05 DIAGNOSIS — T7840XA Allergy, unspecified, initial encounter: Secondary | ICD-10-CM | POA: Diagnosis not present

## 2023-01-25 ENCOUNTER — Encounter: Payer: Self-pay | Admitting: Oncology
